# Patient Record
Sex: Female | Born: 1990 | Hispanic: Yes | Marital: Single | Smoking: Never smoker
Health system: Southern US, Community
[De-identification: ages and names within clinical notes are randomized; demographics above are authoritative.]

## PROBLEM LIST (undated history)

## (undated) ENCOUNTER — Inpatient Hospital Stay (HOSPITAL_COMMUNITY): Payer: Self-pay

## (undated) DIAGNOSIS — A749 Chlamydial infection, unspecified: Secondary | ICD-10-CM

## (undated) DIAGNOSIS — O039 Complete or unspecified spontaneous abortion without complication: Secondary | ICD-10-CM

## (undated) DIAGNOSIS — Z789 Other specified health status: Secondary | ICD-10-CM

## (undated) HISTORY — PX: OTHER SURGICAL HISTORY: SHX169

## (undated) HISTORY — DX: Other specified health status: Z78.9

## (undated) HISTORY — DX: Complete or unspecified spontaneous abortion without complication: O03.9

---
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
## 2012-02-26 LAB — OB RESULTS CONSOLE GC/CHLAMYDIA
Chlamydia: NEGATIVE
Gonorrhea: NEGATIVE

## 2012-02-26 LAB — OB RESULTS CONSOLE RUBELLA ANTIBODY, IGM: Rubella: IMMUNE

## 2012-04-12 ENCOUNTER — Encounter (HOSPITAL_COMMUNITY): Payer: Self-pay

## 2012-04-12 ENCOUNTER — Inpatient Hospital Stay (HOSPITAL_COMMUNITY)
Admission: AD | Admit: 2012-04-12 | Discharge: 2012-04-12 | Disposition: A | Discharge status: Home / Self Care | Payer: Self-pay | Source: Ambulatory Visit | Attending: Obstetrics | Admitting: Obstetrics

## 2012-04-12 DIAGNOSIS — R109 Unspecified abdominal pain: Secondary | ICD-10-CM | POA: Insufficient documentation

## 2012-04-12 DIAGNOSIS — O4700 False labor before 37 completed weeks of gestation, unspecified trimester: Secondary | ICD-10-CM | POA: Insufficient documentation

## 2012-04-12 LAB — URINALYSIS, ROUTINE W REFLEX MICROSCOPIC
Hgb urine dipstick: NEGATIVE
Nitrite: NEGATIVE
Protein, ur: NEGATIVE mg/dL
Specific Gravity, Urine: 1.01 (ref 1.005–1.030)
Urobilinogen, UA: 0.2 mg/dL (ref 0.0–1.0)

## 2012-04-12 LAB — FETAL FIBRONECTIN: Fetal Fibronectin: NEGATIVE

## 2012-04-12 LAB — URINE MICROSCOPIC-ADD ON

## 2012-04-12 MED ORDER — TERBUTALINE SULFATE 1 MG/ML IJ SOLN
INTRAMUSCULAR | Status: AC
  Administered 2012-04-12: 1 mg via SUBCUTANEOUS
  Filled 2012-04-12: qty 1

## 2012-04-12 MED ORDER — TERBUTALINE SULFATE 1 MG/ML IJ SOLN
0.2500 mg | Freq: Once | INTRAMUSCULAR | Status: AC
  Administered 2012-04-12: 1 mg via SUBCUTANEOUS
  Filled 2012-04-12: qty 1

## 2012-04-12 MED ORDER — TERBUTALINE SULFATE 1 MG/ML IJ SOLN
0.2500 mg | Freq: Once | INTRAMUSCULAR | Status: AC
  Administered 2012-04-12: 1 mg via SUBCUTANEOUS

## 2012-04-12 MED ORDER — LACTATED RINGERS IV BOLUS (SEPSIS)
1000.0000 mL | Freq: Once | INTRAVENOUS | Status: AC
  Administered 2012-04-12: 1000 mL via INTRAVENOUS

## 2012-04-12 NOTE — MAU Provider Note (Signed)
History     CSN: [id number]  Arrival date and time: 04/12/12 1454   First Provider Initiated Contact with Patient 04/12/12 1547      Chief Complaint  Patient presents with  . Lower abd cramping    HPI  Pt is [redacted]w[redacted]d pregnant and presents with lower abdominal pain since last night. She has back pain associated with the abd pain.    The pain comes and goes.  She denies pain with urination, constipation, fever or chills.  Pt denies spotting, bleeding or LOF.  Baby has been active.  Pt has had some nausea but no vomiting.  She last ate about 3-4 hours ago.  Pt denies complications with her pregnancy. She last saw Dr. Gaynell Face yesterday.  She last had IC 2 days ago.   Past Medical History  Diagnosis Date  . Chlamydia   . Complete abortion 07/27/2011    Past Surgical History  Procedure Date  . Left elbow surgery     Family History  Problem Relation Age of Onset  . Anesthesia problems Neg Hx     History  Substance Use Topics  . Smoking status: Never Smoker   . Smokeless tobacco: Never Used  . Alcohol Use: No     prior to pregnancy    Allergies: No Known Allergies  Prescriptions prior to admission  Medication Sig Dispense Refill  . Prenatal Vit-Fe Fumarate-FA (PRENATAL MULTIVITAMIN) TABS Take 1 tablet by mouth daily.      Marland Kitchen PRESCRIPTION MEDICATION Take 1 tablet by mouth 2 (two) times daily. Pt takes Rx Iron supplement; does not know name/strength        Review of Systems  Constitutional: Negative for fever and chills.  Gastrointestinal: Positive for nausea and abdominal pain. Negative for vomiting, diarrhea and constipation.  Genitourinary: Negative for dysuria and urgency.  Musculoskeletal: Positive for back pain.   Physical Exam   Blood pressure 109/71, pulse 88, temperature 98 F (36.7 C), temperature source Oral, resp. rate 16, height 4' 11.25" (1.505 m), weight 71.385 kg (157 lb 6 oz), last menstrual period 08/21/2011.  Physical Exam  Nursing note and vitals  reviewed. Constitutional: She is oriented to person, place, and time. She appears well-developed and well-nourished.  HENT:  Head: Normocephalic.  Eyes: Pupils are equal, round, and reactive to light.  Neck: Normal range of motion. Neck supple.  Cardiovascular: Normal rate.   Respiratory: Effort normal.  GI: Soft. She exhibits no distension. There is no tenderness. There is no rebound and no guarding.       Uterine irritability with occ ctx; cervix external os 1 cm internal os closed; soft; FHR reactive  Musculoskeletal: Normal range of motion.  Neurological: She is alert and oriented to person, place, and time.  Skin: Skin is warm and dry.  Psychiatric: She has a normal mood and affect.    MAU Course  Procedures Discussed with Dr. Gaynell Face- will give IVF and one dose of terb0.25 and repeat in 30 min if needed Ctx diminished after one dose of terb but was still having some discomfort- 2nd dose of terb was given and pt was comfortable with some uterine irritability but no ctx noted. Results for orders placed during the hospital encounter of 04/12/12 (from the past 24 hour(s))  URINALYSIS, ROUTINE W REFLEX MICROSCOPIC     Status: Abnormal   Collection Time   04/12/12  3:21 PM      Component Value Range   Color, Urine YELLOW  YELLOW   APPearance HAZY (*)  CLEAR   Specific Gravity, Urine 1.010  1.005 - 1.030   pH 7.0  5.0 - 8.0   Glucose, UA NEGATIVE  NEGATIVE mg/dL   Hgb urine dipstick NEGATIVE  NEGATIVE   Bilirubin Urine SMALL (*) NEGATIVE   Ketones, ur NEGATIVE  NEGATIVE mg/dL   Protein, ur NEGATIVE  NEGATIVE mg/dL   Urobilinogen, UA 0.2  0.0 - 1.0 mg/dL   Nitrite NEGATIVE  NEGATIVE   Leukocytes, UA MODERATE (*) NEGATIVE  URINE MICROSCOPIC-ADD ON     Status: Abnormal   Collection Time   04/12/12  3:21 PM      Component Value Range   Squamous Epithelial / LPF MANY (*) RARE   WBC, UA 11-20  <3 WBC/hpf   Bacteria, UA FEW (*) RARE  FETAL FIBRONECTIN     Status: Normal    Collection Time   04/12/12  4:20 PM      Component Value Range   Fetal Fibronectin NEGATIVE  NEGATIVE   Assessment and Plan  Threatened preterm labor D/c home f/u with Dr. Gaynell Face next week Sooner if increase in symptoms  Brandi Austin 04/12/2012, 3:48 PM

## 2012-04-12 NOTE — MAU Note (Signed)
Patient up to bathroom

## 2012-04-12 NOTE — MAU Note (Signed)
Gave patient pitcher of water encourage to drink to decrease uterine irritability, patient verbalized an understanding.

## 2012-04-12 NOTE — MAU Note (Signed)
Pt states lower abd pain/cramping that began last pm, began on r side, now on both sides. Denies uti s/s, nor abnormal vaginal d/c or bleeding. Pain is intermittent.

## 2012-04-12 NOTE — MAU Note (Signed)
Patient states she is feeling less discomfort no contractions seen on efm strip.

## 2012-04-13 LAB — URINE CULTURE: Culture: NO GROWTH

## 2012-05-05 ENCOUNTER — Encounter (HOSPITAL_COMMUNITY): Payer: Self-pay | Admitting: Anesthesiology

## 2012-05-05 ENCOUNTER — Inpatient Hospital Stay (HOSPITAL_COMMUNITY)
Admission: AD | Admit: 2012-05-05 | Discharge: 2012-05-07 | DRG: 775 | Disposition: A | Discharge status: Home / Self Care | Payer: Medicaid Other | Source: Ambulatory Visit | Attending: Obstetrics | Admitting: Obstetrics

## 2012-05-05 ENCOUNTER — Encounter (HOSPITAL_COMMUNITY): Payer: Self-pay | Admitting: Obstetrics and Gynecology

## 2012-05-05 ENCOUNTER — Inpatient Hospital Stay (HOSPITAL_COMMUNITY): Payer: Medicaid Other | Admitting: Anesthesiology

## 2012-05-05 DIAGNOSIS — A749 Chlamydial infection, unspecified: Secondary | ICD-10-CM

## 2012-05-05 DIAGNOSIS — O039 Complete or unspecified spontaneous abortion without complication: Secondary | ICD-10-CM

## 2012-05-05 LAB — CBC
HCT: 38.9 % (ref 36.0–46.0)
MCHC: 32.6 g/dL (ref 30.0–36.0)
Platelets: 231 10*3/uL (ref 150–400)
RDW: 21.4 % — ABNORMAL HIGH (ref 11.5–15.5)
WBC: 9.1 10*3/uL (ref 4.0–10.5)

## 2012-05-05 LAB — RPR: RPR Ser Ql: NONREACTIVE

## 2012-05-05 LAB — TYPE AND SCREEN
ABO/RH(D): O POS
Antibody Screen: NEGATIVE

## 2012-05-05 MED ORDER — IBUPROFEN 600 MG PO TABS
600.0000 mg | ORAL_TABLET | Freq: Four times a day (QID) | ORAL | Status: DC | PRN
  Administered 2012-05-06: 600 mg via ORAL
  Filled 2012-05-05: qty 1

## 2012-05-05 MED ORDER — LACTATED RINGERS IV SOLN
500.0000 mL | INTRAVENOUS | Status: DC | PRN
Start: 2012-05-05 — End: 2012-05-06

## 2012-05-05 MED ORDER — FLEET ENEMA 7-19 GM/118ML RE ENEM: 1.0000 | ENEMA | RECTAL | Status: DC | PRN

## 2012-05-05 MED ORDER — BUTORPHANOL TARTRATE 1 MG/ML IJ SOLN
1.0000 mg | INTRAMUSCULAR | Status: DC | PRN
  Administered 2012-05-05: 1 mg via INTRAVENOUS

## 2012-05-05 MED ORDER — CITRIC ACID-SODIUM CITRATE 334-500 MG/5ML PO SOLN: 30.0000 mL | ORAL | Status: DC | PRN

## 2012-05-05 MED ORDER — BUTORPHANOL TARTRATE 1 MG/ML IJ SOLN
1.0000 mg | INTRAMUSCULAR | Status: DC
  Filled 2012-05-05: qty 1

## 2012-05-05 MED ORDER — AMPICILLIN SODIUM 2 G IJ SOLR
2.0000 g | Freq: Once | INTRAMUSCULAR | Status: AC
  Administered 2012-05-05: 2 g via INTRAVENOUS
  Filled 2012-05-05: qty 2000

## 2012-05-05 MED ORDER — OXYTOCIN BOLUS FROM INFUSION
500.0000 mL | INTRAVENOUS | Status: DC
  Administered 2012-05-05: 500 mL via INTRAVENOUS

## 2012-05-05 MED ORDER — ONDANSETRON HCL 4 MG/2ML IJ SOLN: 4.0000 mg | Freq: Four times a day (QID) | INTRAMUSCULAR | Status: DC | PRN

## 2012-05-05 MED ORDER — PHENYLEPHRINE 40 MCG/ML (10ML) SYRINGE FOR IV PUSH (FOR BLOOD PRESSURE SUPPORT): 80.0000 ug | PREFILLED_SYRINGE | INTRAVENOUS | Status: DC | PRN

## 2012-05-05 MED ORDER — DIPHENHYDRAMINE HCL 50 MG/ML IJ SOLN: 12.5000 mg | INTRAMUSCULAR | Status: DC | PRN

## 2012-05-05 MED ORDER — FENTANYL 2.5 MCG/ML BUPIVACAINE 1/10 % EPIDURAL INFUSION (WH - ANES)
14.0000 mL/h | INTRAMUSCULAR | Status: DC
  Filled 2012-05-05 (×2): qty 125

## 2012-05-05 MED ORDER — OXYTOCIN 40 UNITS IN LACTATED RINGERS INFUSION - SIMPLE MED
62.5000 mL/h | INTRAVENOUS | Status: DC
  Filled 2012-05-05: qty 1000

## 2012-05-05 MED ORDER — LACTATED RINGERS IV SOLN: 500.0000 mL | Freq: Once | INTRAVENOUS | Status: DC

## 2012-05-05 MED ORDER — ACETAMINOPHEN 325 MG PO TABS: 650.0000 mg | ORAL_TABLET | ORAL | Status: DC | PRN

## 2012-05-05 MED ORDER — OXYCODONE-ACETAMINOPHEN 5-325 MG PO TABS
1.0000 | ORAL_TABLET | ORAL | Status: DC | PRN
Start: 2012-05-05 — End: 2012-05-07

## 2012-05-05 MED ORDER — PHENYLEPHRINE 40 MCG/ML (10ML) SYRINGE FOR IV PUSH (FOR BLOOD PRESSURE SUPPORT)
80.0000 ug | PREFILLED_SYRINGE | INTRAVENOUS | Status: DC | PRN
  Filled 2012-05-05: qty 5

## 2012-05-05 MED ORDER — FENTANYL 2.5 MCG/ML BUPIVACAINE 1/10 % EPIDURAL INFUSION (WH - ANES)
INTRAMUSCULAR | Status: DC | PRN
  Administered 2012-05-05: 12 mL/h via EPIDURAL

## 2012-05-05 MED ORDER — EPHEDRINE 5 MG/ML INJ
10.0000 mg | INTRAVENOUS | Status: DC | PRN
  Filled 2012-05-05: qty 4

## 2012-05-05 MED ORDER — PROMETHAZINE HCL 25 MG/ML IJ SOLN
12.5000 mg | Freq: Once | INTRAMUSCULAR | Status: AC
  Administered 2012-05-05: 12.5 mg via INTRAVENOUS
  Filled 2012-05-05: qty 1

## 2012-05-05 MED ORDER — SODIUM BICARBONATE 8.4 % IV SOLN
INTRAVENOUS | Status: DC | PRN
  Administered 2012-05-05: 4 mL via EPIDURAL

## 2012-05-05 MED ORDER — LACTATED RINGERS IV SOLN
INTRAVENOUS | Status: DC
  Administered 2012-05-05 (×3): via INTRAVENOUS

## 2012-05-05 MED ORDER — LIDOCAINE HCL (PF) 1 % IJ SOLN
30.0000 mL | INTRAMUSCULAR | Status: DC | PRN
  Filled 2012-05-05: qty 30

## 2012-05-05 MED ORDER — EPHEDRINE 5 MG/ML INJ: 10.0000 mg | INTRAVENOUS | Status: DC | PRN

## 2012-05-05 NOTE — MAU Note (Signed)
"  I started having contractions stronger about an hour ago.  (+) FM."

## 2012-05-05 NOTE — MAU Note (Signed)
Pt reports having ctx since this morning coming about  q 2-3 min now . Reports some leaking of clear fluid and good fetal movment

## 2012-05-05 NOTE — H&P (Signed)
This is Dr. Francoise Ceo dictating the history and physical on  Brandi Austin she's a 21 year old gravida 2 para 0010 EDC 1214 1337 weeks plus she was admitted in labor with ruptured membranes fluid clear GBS was unknown and she was treated with penicillin Past medical history negative Past surgical history negative System review negative Physical exam well-developed female in labor HEENT negative Breasts negative Heart regular rhythm no murmurs no gallops Lungs clear to P&A Breasts negative Abdomen term Legs negative

## 2012-05-05 NOTE — Anesthesia Preprocedure Evaluation (Signed)

## 2012-05-05 NOTE — Anesthesia Procedure Notes (Signed)
Epidural Patient location during procedure: OB  Preanesthetic Checklist Completed: patient identified, site marked, surgical consent, pre-op evaluation, timeout performed, IV checked, risks and benefits discussed and monitors and equipment checked  Epidural Patient position: sitting Prep: site prepped and draped and DuraPrep Patient monitoring: continuous pulse ox and blood pressure Approach: midline Injection technique: LOR air  Needle:  Needle type: Tuohy  Needle gauge: 17 G Needle length: 9 cm and 9 Needle insertion depth: 5 cm cm Catheter type: closed end flexible Catheter size: 19 Gauge Catheter at skin depth: 10 cm Test dose: negative  Assessment Events: blood not aspirated, injection not painful, no injection resistance, negative IV test and no paresthesia  Additional Notes Dosing of Epidural:  1st dose, through catheter ............................................. epi 1:200K + Xylocaine 40 mg  2nd dose, through catheter, after waiting 3 minutes.....epi 1:200K + Xylocaine 40 mg   ( 2% Xylo charted as a single dose in Epic Meds for ease of charting; actual dosing was fractionated as above, for saftey's sake)  As each dose occurred, patient was free of IV sx; and patient exhibited no evidence of SA injection.  Patient is more comfortable after epidural dosed. Please see RN's note for documentation of vital signs,and FHR which are stable.  Patient reminded not to try to ambulate with numb legs, and that an RN must be present the 1st time she attempts to get up.    

## 2012-05-06 ENCOUNTER — Encounter (HOSPITAL_COMMUNITY): Payer: Self-pay | Admitting: *Deleted

## 2012-05-06 LAB — CBC
Hemoglobin: 9.9 g/dL — ABNORMAL LOW (ref 12.0–15.0)
Platelets: 193 10*3/uL (ref 150–400)
RBC: 3.99 MIL/uL (ref 3.87–5.11)
WBC: 16.9 10*3/uL — ABNORMAL HIGH (ref 4.0–10.5)

## 2012-05-06 LAB — RAPID HIV SCREEN (WH-MAU): Rapid HIV Screen: NONREACTIVE

## 2012-05-06 MED ORDER — IBUPROFEN 600 MG PO TABS
600.0000 mg | ORAL_TABLET | Freq: Four times a day (QID) | ORAL | Status: DC
  Administered 2012-05-06 – 2012-05-07 (×5): 600 mg via ORAL
  Filled 2012-05-06 (×5): qty 1

## 2012-05-06 MED ORDER — SENNOSIDES-DOCUSATE SODIUM 8.6-50 MG PO TABS
2.0000 | ORAL_TABLET | Freq: Every day | ORAL | Status: DC
  Administered 2012-05-06: 2 via ORAL

## 2012-05-06 MED ORDER — LANOLIN HYDROUS EX OINT: TOPICAL_OINTMENT | CUTANEOUS | Status: DC | PRN

## 2012-05-06 MED ORDER — INFLUENZA VIRUS VACC SPLIT PF IM SUSP
0.5000 mL | INTRAMUSCULAR | Status: AC
  Administered 2012-05-07: 0.5 mL via INTRAMUSCULAR
  Filled 2012-05-06: qty 0.5

## 2012-05-06 MED ORDER — PRENATAL MULTIVITAMIN CH
1.0000 | ORAL_TABLET | Freq: Every day | ORAL | Status: DC
  Administered 2012-05-06 – 2012-05-07 (×2): 1 via ORAL
  Filled 2012-05-06 (×2): qty 1

## 2012-05-06 MED ORDER — OXYCODONE-ACETAMINOPHEN 5-325 MG PO TABS: 1.0000 | ORAL_TABLET | ORAL | Status: DC | PRN

## 2012-05-06 MED ORDER — BENZOCAINE-MENTHOL 20-0.5 % EX AERO: 1.0000 "application " | INHALATION_SPRAY | CUTANEOUS | Status: DC | PRN

## 2012-05-06 MED ORDER — DIPHENHYDRAMINE HCL 25 MG PO CAPS: 25.0000 mg | ORAL_CAPSULE | Freq: Four times a day (QID) | ORAL | Status: DC | PRN

## 2012-05-06 MED ORDER — DIBUCAINE 1 % RE OINT: 1.0000 "application " | TOPICAL_OINTMENT | RECTAL | Status: DC | PRN

## 2012-05-06 MED ORDER — TETANUS-DIPHTH-ACELL PERTUSSIS 5-2.5-18.5 LF-MCG/0.5 IM SUSP: 0.5000 mL | Freq: Once | INTRAMUSCULAR | Status: DC

## 2012-05-06 MED ORDER — ONDANSETRON HCL 4 MG/2ML IJ SOLN: 4.0000 mg | INTRAMUSCULAR | Status: DC | PRN

## 2012-05-06 MED ORDER — ZOLPIDEM TARTRATE 5 MG PO TABS: 5.0000 mg | ORAL_TABLET | Freq: Every evening | ORAL | Status: DC | PRN

## 2012-05-06 MED ORDER — ONDANSETRON HCL 4 MG PO TABS: 4.0000 mg | ORAL_TABLET | ORAL | Status: DC | PRN

## 2012-05-06 MED ORDER — FERROUS SULFATE 325 (65 FE) MG PO TABS
325.0000 mg | ORAL_TABLET | Freq: Two times a day (BID) | ORAL | Status: DC
  Administered 2012-05-06 – 2012-05-07 (×3): 325 mg via ORAL
  Filled 2012-05-06 (×3): qty 1

## 2012-05-06 MED ORDER — WITCH HAZEL-GLYCERIN EX PADS: 1.0000 "application " | MEDICATED_PAD | CUTANEOUS | Status: DC | PRN

## 2012-05-06 MED ORDER — SIMETHICONE 80 MG PO CHEW: 80.0000 mg | CHEWABLE_TABLET | ORAL | Status: DC | PRN

## 2012-05-06 NOTE — Progress Notes (Signed)
UR chart review completed.  

## 2012-05-06 NOTE — Progress Notes (Signed)
Patient ID: Brandi Austin, female   DOB: [birth year], 21 y.o.   MRN: [id number] Postpartum day one Fundus firm Lochia moderate Legs negative No complaints

## 2012-05-06 NOTE — Anesthesia Postprocedure Evaluation (Signed)
  Anesthesia Post-op Note  Patient: Brandi Austin  Procedure(s) Performed: * No procedures listed *  Patient Location: Mother/Baby  Anesthesia Type:Epidural  Level of Consciousness: awake, alert  and oriented  Airway and Oxygen Therapy: Patient Spontanous Breathing  Post-op Pain: none  Post-op Assessment: Post-op Vital signs reviewed  Post-op Vital Signs: Reviewed and stable  Complications: No apparent anesthesia complications

## 2012-05-07 NOTE — Discharge Summary (Signed)
Obstetric Discharge Summary Reason for Admission: onset of labor Prenatal Procedures: none Intrapartum Procedures: spontaneous vaginal delivery Postpartum Procedures: none Complications-Operative and Postpartum: none Hemoglobin  Date Value Range Status  05/06/2012 9.9* 12.0 - 15.0 g/dL Final     REPEATED TO VERIFY     DELTA CHECK NOTED     HCT  Date Value Range Status  05/06/2012 31.1* 36.0 - 46.0 % Final    Physical Exam:  General: alert Lochia: appropriate Uterine Fundus: firm Incision: healing well DVT Evaluation: No evidence of DVT seen on physical exam.  Discharge Diagnoses: Term Pregnancy-delivered  Discharge Information: Date: 05/07/2012 Activity: pelvic rest Diet: routine Medications: Percocet Condition: stable Instructions: refer to practice specific booklet Discharge to: home Follow-up Information    Call in 6 weeks to follow up.   Contact information:   b [address]         Newborn Data: Live born female  Birth Weight: 6 lb 12 oz (3062 g) APGAR: 9, 9  Home with mother.  Rahm Minix A 05/07/2012, 7:35 AM

## 2013-04-11 ENCOUNTER — Other Ambulatory Visit: Payer: Self-pay | Admitting: Obstetrics

## 2014-04-13 ENCOUNTER — Encounter (HOSPITAL_COMMUNITY): Payer: Self-pay | Admitting: *Deleted

## 2015-11-16 ENCOUNTER — Encounter (HOSPITAL_COMMUNITY): Payer: Self-pay

## 2015-11-16 ENCOUNTER — Emergency Department (HOSPITAL_COMMUNITY)
Admission: EM | Admit: 2015-11-16 | Discharge: 2015-11-16 | Disposition: A | Discharge status: Home / Self Care | Payer: Medicaid Other | Attending: Emergency Medicine | Admitting: Emergency Medicine

## 2015-11-16 DIAGNOSIS — Z5189 Encounter for other specified aftercare: Secondary | ICD-10-CM

## 2015-11-16 DIAGNOSIS — Z8619 Personal history of other infectious and parasitic diseases: Secondary | ICD-10-CM | POA: Insufficient documentation

## 2015-11-16 DIAGNOSIS — Z4800 Encounter for change or removal of nonsurgical wound dressing: Secondary | ICD-10-CM | POA: Insufficient documentation

## 2015-11-16 NOTE — ED Provider Notes (Signed)
CSN: [id number]     Arrival date & time 11/16/15  1412 History  By signing my name below, I, Essence Howell and Alyssa Grove, attest that this documentation has been prepared under the direction and in the presence of Shawn Joy, PA-C. Electronically Signed: Charline Bills, ED Scribe 11/16/2015 at 3:36 PM.    Chief Complaint  Patient presents with  . Tick Removal    The history is provided by the patient. No language interpreter was used.   HPI Comments: Brandi Austin is a 25 y.o. female who presents to the Emergency Department complaining of pain from a tick bite located on her right lower back onset of this morning. Pt stated that her husband attempted to remove the tick, but she reports that he only removed part of it. Pain is worsened by palpation. No active bleeding from the wound. Pt denies any other complaints.   Past Medical History  Diagnosis Date  . Chlamydia   . Complete abortion 07/27/2011   Past Surgical History  Procedure Laterality Date  . Left elbow surgery     Family History  Problem Relation Age of Onset  . Anesthesia problems Neg Hx    Social History  Substance Use Topics  . Smoking status: Never Smoker   . Smokeless tobacco: Never Used  . Alcohol Use: No     Comment: prior to pregnancy   OB History    Gravida Para Term Preterm AB TAB SAB Ectopic Multiple Living   Review of Systems  Constitutional: Negative for fever.  Skin: Positive for wound. Negative for rash.    Allergies  Review of patient's allergies indicates no known allergies.  Home Medications   Prior to Admission medications   Not on File   BP 122/76 mmHg  Pulse 86  Temp(Src) 98.3 F (36.8 C) (Oral)  Resp 18  Ht  (1.6 m)  Wt 160 lb (72.576 kg)  BMI 28.35 kg/m2  SpO2 100%  LMP 11/08/2015 Physical Exam  Constitutional: She is oriented to person, place, and time. She appears well-developed and well-nourished. No distress.  HENT:  Head:  Normocephalic and atraumatic.  Eyes: Conjunctivae are normal.  Neck: Neck supple.  Cardiovascular: Normal rate.   Pulmonary/Chest: Effort normal. No respiratory distress.  Musculoskeletal: Normal range of motion.  Neurological: She is alert and oriented to person, place, and time.  Skin: Skin is warm and dry.  Tiny, superficial wound to right lower back No bleeding or exudate No foreign body noted  Psychiatric: She has a normal mood and affect. Her behavior is normal.  Nursing note and vitals reviewed.   ED Course  Procedures (including critical care time) DIAGNOSTIC STUDIES: Oxygen Saturation is 100% on RA, normal by my interpretation.    COORDINATION OF CARE: 3:28 PM-Discussed treatment plan which includes wound care with pt at bedside and pt agreed to plan.    EKG Interpretation None         MDM   Final diagnoses:  Visit for wound check    TAMMRA PRESSMAN presents for a check of a possible tick bite.  No tick or foreign body noted. No evidence to suggest cellulitis. Wound care and return precautions discussed, including checking for signs of infection. Patient voiced understanding of these instructions and is comfortable with discharge.  I personally performed the services described in this documentation, which was scribed in my presence. The recorded information has  been reviewed and is accurate.   Anselm PancoastShawn C Joy, PA-C 11/16/15 1604  Glynn OctaveStephen Rancour, MD 11/16/15 1650

## 2015-11-16 NOTE — ED Notes (Signed)
Rt. Flank area, pt. Felt a tick on her and her husband attempted to remove it , but left the head .  Area is stinging.  Mild redness noted.

## 2015-11-16 NOTE — ED Notes (Signed)
I & D tray at bedside if needed. 

## 2015-11-16 NOTE — Discharge Instructions (Signed)
You have been seen today for a wound check of a possible tick bite. There were no signs of a tick on your skin. Keep the area clean and dry. He may apply topical antibiotic ointment, such as Neosporin. Follow up with PCP as needed should symptoms continue. Return to ED should symptoms worsen or there is spreading redness, fever, increased pain, or swelling. May use naproxen, ibuprofen, or Tylenol for pain.

## 2015-11-16 NOTE — ED Notes (Signed)
Declined W/C at D/C and was escorted to lobby by RN. 

## 2018-03-12 ENCOUNTER — Encounter (HOSPITAL_COMMUNITY): Payer: Self-pay | Admitting: Emergency Medicine

## 2018-03-12 ENCOUNTER — Emergency Department (HOSPITAL_COMMUNITY): Payer: Self-pay

## 2018-03-12 ENCOUNTER — Emergency Department (HOSPITAL_COMMUNITY)
Admission: EM | Admit: 2018-03-12 | Discharge: 2018-03-12 | Disposition: A | Discharge status: Home / Self Care | Payer: Self-pay | Attending: Emergency Medicine | Admitting: Emergency Medicine

## 2018-03-12 ENCOUNTER — Other Ambulatory Visit: Payer: Self-pay

## 2018-03-12 DIAGNOSIS — Z79899 Other long term (current) drug therapy: Secondary | ICD-10-CM | POA: Insufficient documentation

## 2018-03-12 DIAGNOSIS — M545 Low back pain: Secondary | ICD-10-CM | POA: Insufficient documentation

## 2018-03-12 DIAGNOSIS — R1011 Right upper quadrant pain: Secondary | ICD-10-CM | POA: Insufficient documentation

## 2018-03-12 DIAGNOSIS — R110 Nausea: Secondary | ICD-10-CM | POA: Insufficient documentation

## 2018-03-12 LAB — URINALYSIS, ROUTINE W REFLEX MICROSCOPIC
BILIRUBIN URINE: NEGATIVE
Glucose, UA: NEGATIVE mg/dL
Ketones, ur: NEGATIVE mg/dL
Nitrite: NEGATIVE
PH: 5 (ref 5.0–8.0)
Protein, ur: NEGATIVE mg/dL
SPECIFIC GRAVITY, URINE: 1.021 (ref 1.005–1.030)

## 2018-03-12 LAB — CBC
HCT: 38.4 % (ref 36.0–46.0)
Hemoglobin: 12.6 g/dL (ref 12.0–15.0)
MCH: 30.2 pg (ref 26.0–34.0)
MCHC: 32.8 g/dL (ref 30.0–36.0)
MCV: 92.1 fL (ref 78.0–100.0)
PLATELETS: 206 10*3/uL (ref 150–400)
RBC: 4.17 MIL/uL (ref 3.87–5.11)
RDW: 13.5 % (ref 11.5–15.5)
WBC: 8.3 10*3/uL (ref 4.0–10.5)

## 2018-03-12 LAB — DIFFERENTIAL
ABS IMMATURE GRANULOCYTES: 0 10*3/uL (ref 0.0–0.1)
BASOS PCT: 0 %
Basophils Absolute: 0 10*3/uL (ref 0.0–0.1)
Eosinophils Absolute: 0.3 10*3/uL (ref 0.0–0.7)
Eosinophils Relative: 3 %
IMMATURE GRANULOCYTES: 1 %
LYMPHS ABS: 2.2 10*3/uL (ref 0.7–4.0)
Lymphocytes Relative: 26 %
Monocytes Absolute: 0.6 10*3/uL (ref 0.1–1.0)
Monocytes Relative: 8 %
NEUTROS PCT: 62 %
Neutro Abs: 5.1 10*3/uL (ref 1.7–7.7)

## 2018-03-12 LAB — COMPREHENSIVE METABOLIC PANEL
ALK PHOS: 75 U/L (ref 38–126)
ALT: 27 U/L (ref 0–44)
AST: 24 U/L (ref 15–41)
Albumin: 3.8 g/dL (ref 3.5–5.0)
Anion gap: 7 (ref 5–15)
BILIRUBIN TOTAL: 0.4 mg/dL (ref 0.3–1.2)
BUN: 12 mg/dL (ref 6–20)
CALCIUM: 8.9 mg/dL (ref 8.9–10.3)
CO2: 24 mmol/L (ref 22–32)
CREATININE: 0.65 mg/dL (ref 0.44–1.00)
Chloride: 106 mmol/L (ref 98–111)
GFR calc Af Amer: >60 mL/min (ref >60)
GFR calc non Af Amer: >60 mL/min (ref >60)
Glucose, Bld: 105 mg/dL — ABNORMAL HIGH (ref 70–99)
Potassium: 3.8 mmol/L (ref 3.5–5.1)
Sodium: 137 mmol/L (ref 135–145)
TOTAL PROTEIN: 6.9 g/dL (ref 6.5–8.1)

## 2018-03-12 LAB — I-STAT BETA HCG BLOOD, ED (MC, WL, AP ONLY): I-stat hCG, quantitative: <5 m[IU]/mL (ref <5)

## 2018-03-12 LAB — LIPASE, BLOOD: Lipase: 33 U/L (ref 11–51)

## 2018-03-12 LAB — HCG, QUANTITATIVE, PREGNANCY

## 2018-03-12 MED ORDER — ONDANSETRON 4 MG PO TBDP: 4.0000 mg | ORAL_TABLET | Freq: Three times a day (TID) | ORAL | 0 refills | Status: DC | PRN

## 2018-03-12 MED ORDER — DICYCLOMINE HCL 20 MG PO TABS: 20.0000 mg | ORAL_TABLET | Freq: Two times a day (BID) | ORAL | 0 refills | Status: DC

## 2018-03-12 NOTE — ED Notes (Signed)
ED Provider at bedside. 

## 2018-03-12 NOTE — ED Notes (Signed)
Got patient on the monitor into a gown patient is resting with family at bedside and call bell in reach

## 2018-03-12 NOTE — ED Provider Notes (Signed)
[facility] EMERGENCY DEPARTMENT Provider Note   CSN: [id number] Arrival date & time: 03/12/18  0734     History   Chief Complaint Chief Complaint  Patient presents with  . Abdominal Pain    HPI Brandi Austin is a 27 y.o. female.  HPI   Brandi Austin is a 27 y.o. female, with a history of chlamydia and abortion, presenting to the ED with abdominal pain beginning last week.   Pain began in RUQ, intermittent, pressure, 7/10, initially nonradiating. Seemed to occur with palpation or bending over. Pain now involves the bilateral lower back and lower abdominal pain, still feels like pressure/cramping, still intermittent. Accompanied by intermittent nausea and loose stools beginning 9/27 with 2-3 stools a day.  LMP began 9/27, still spotting, normal timing and flow for her. States she had a positive pregnancy test at home on 9/25, went to health dept 9/27 and test was negative. Denies fever/chills, vomiting, hematochezia/melena, urinary complaints, abnormal vaginal discharge, SOB, or any other complaints.     Past Medical History:  Diagnosis Date  . Chlamydia   . Complete abortion 07/27/2011    Patient Active Problem List   Diagnosis Date Noted  . Complete abortion 07/27/2011  . Chlamydia 07/27/2011    Past Surgical History:  Procedure Laterality Date  . left elbow surgery       OB History    Gravida  2   Para  1   Term  1   Preterm      AB  1   Living  1     SAB  1   TAB      Ectopic      Multiple      Live Births  1            Home Medications    Prior to Admission medications   Medication Sig Start Date End Date Taking? Authorizing Provider  Multiple Vitamin (MULTIVITAMIN) tablet Take 1 tablet by mouth daily.   Yes [provider]  dicyclomine (BENTYL) 20 MG tablet Take 1 tablet (20 mg total) by mouth 2 (two) times daily. 03/12/18   Joy, Shawn C, PA-C  ondansetron (ZOFRAN ODT) 4 MG disintegrating  tablet Take 1 tablet (4 mg total) by mouth every 8 (eight) hours as needed for nausea or vomiting. 03/12/18   Joy, Hillard Danker, PA-C    Family History Family History  Problem Relation Age of Onset  . Anesthesia problems Neg Hx     Social History Social History   Tobacco Use  . Smoking status: Never Smoker  . Smokeless tobacco: Never Used  Substance Use Topics  . Alcohol use: No    Comment: prior to pregnancy  . Drug use: No     Allergies   Patient has no known allergies.   Review of Systems Review of Systems  Constitutional: Negative for chills, diaphoresis and fever.  Respiratory: Negative for shortness of breath.   Cardiovascular: Negative for chest pain.  Gastrointestinal: Positive for abdominal pain and nausea. Negative for abdominal distention, blood in stool, constipation and vomiting.  Genitourinary: Negative for dysuria, frequency, hematuria and vaginal discharge.  Musculoskeletal: Positive for back pain.  Neurological: Negative for weakness and numbness.  All other systems reviewed and are negative.    Physical Exam Updated Vital Signs BP 132/69 (BP Location: Right Arm)   Pulse 65   Temp 98.6 F (37 C) (Oral)   Resp 14   Wt 74.8 kg  LMP 03/09/2018   SpO2 98%   BMI 29.23 kg/m   Physical Exam  Constitutional: She appears well-developed and well-nourished. No distress.  HENT:  Head: Normocephalic and atraumatic.  Eyes: Conjunctivae are normal.  Neck: Neck supple.  Cardiovascular: Normal rate, regular rhythm, normal heart sounds and intact distal pulses.  Pulmonary/Chest: Effort normal and breath sounds normal. No respiratory distress.  Abdominal: Soft. Bowel sounds are normal. There is tenderness. There is no guarding and no CVA tenderness.    Musculoskeletal: She exhibits no edema.  Lymphadenopathy:    She has no cervical adenopathy.  Neurological: She is alert.  Skin: Skin is warm and dry. She is not diaphoretic.  Psychiatric: She has a normal  mood and affect. Her behavior is normal.  Nursing note and vitals reviewed.    ED Treatments / Results  Labs (all labs ordered are listed, but only abnormal results are displayed) Labs Reviewed  COMPREHENSIVE METABOLIC PANEL - Abnormal; Notable for the following components:      Result Value   Glucose, Bld 105 (*)    All other components within normal limits  URINALYSIS, ROUTINE W REFLEX MICROSCOPIC - Abnormal; Notable for the following components:   APPearance HAZY (*)    Hgb urine dipstick MODERATE (*)    Leukocytes, UA TRACE (*)    Bacteria, UA RARE (*)    All other components within normal limits  URINE CULTURE  LIPASE, BLOOD  CBC  DIFFERENTIAL  HCG, QUANTITATIVE, PREGNANCY  I-STAT BETA HCG BLOOD, ED ([facility], [facility], [facility] ONLY)    EKG None  Radiology US Abdomen Limited Ruq  Result Date: 03/12/2018 CLINICAL DATA:  Right upper quadrant pain. EXAM: ULTRASOUND ABDOMEN LIMITED RIGHT UPPER QUADRANT COMPARISON:  No recent prior. FINDINGS: Gallbladder: No gallstones or wall thickening visualized. No sonographic Murphy sign noted by sonographer. Common bile duct: Diameter: 3.4 mm Liver: Increased echogenicity consistent fatty infiltration and/or hepatocellular disease. Portal vein is patent on color Doppler imaging with normal direction of blood flow towards the liver. IMPRESSION: Negative exam.  No gallstones or biliary distention. Electronically Signed   By: Maisie Fus  Register   On: 03/12/2018 09:39    Procedures Procedures (including critical care time)  Medications Ordered in ED Medications - No data to display   Initial Impression / Assessment and Plan / ED Course  I have reviewed the triage vital signs and the nursing notes.  Pertinent labs & imaging results that were available during my care of the patient were reviewed by me and considered in my medical decision making (see chart for details).  Clinical Course as of Mar 12 1037  Tue Mar 12, 2018  1027 Discussed lab and  imaging results.  Patient's pain has resolved.  Repeat abdominal exam benign.   [SJ]    Clinical Course User Index [SJ] Joy, Shawn C, PA-C    Patient presents with abdominal pain. Patient is nontoxic appearing, afebrile, not tachycardic, not tachypneic, not hypotensive, maintains excellent SPO2 on room air, and is in no apparent distress.  Lab results reassuring.  No acute abnormality is on ultrasound.  Shared decision-making was used regarding CT.  Opted to try some medication management. The patient was given instructions for home care as well as return precautions. Patient voices understanding of these instructions, accepts the plan, and is comfortable with discharge.  Findings and plan of care discussed with Kennis Carina, MD.   Vitals:   03/12/18 0740 03/12/18 0741 03/12/18 0830 03/12/18 0946  BP:  132/69  111/87  Pulse:  65 62 68  Resp:  14    Temp:  98.6 F (37 C)    TempSrc:  Oral    SpO2:  98% 99% 99%  Weight: 74.8 kg        Final Clinical Impressions(s) / ED Diagnoses   Final diagnoses:  RUQ pain    ED Discharge Orders         Ordered    dicyclomine (BENTYL) 20 MG tablet  2 times daily     03/12/18 1032    ondansetron (ZOFRAN ODT) 4 MG disintegrating tablet  Every 8 hours PRN     03/12/18 1032           Anselm Pancoast, PA-C 03/12/18 1038    Sabas Sous, MD 03/12/18 [phone]

## 2018-03-12 NOTE — ED Triage Notes (Signed)
Patient c/o lower back pain and right sided abdominal pain for approx 1 week. Intermittent nausea and diarrhea since Friday. Describes pain as cramping.  Pt adds that she had a positive pregnancy test Wednesday, had follow up appt Friday and test was negative. Light bleeding began Saturday.

## 2018-03-12 NOTE — Discharge Instructions (Addendum)
Abdominal pain, Nausea, and Diarrhea  Hand washing: Wash your hands throughout the day, but especially before and after touching the face, using the restroom, sneezing, coughing, or touching surfaces that have been coughed or sneezed upon. Hydration: Symptoms will be intensified and complicated by dehydration. Dehydration can also extend the duration of symptoms. Drink plenty of fluids and get plenty of rest. You should be drinking at least half a liter of water an hour to stay hydrated. Electrolyte drinks (ex. Gatorade, Powerade, Pedialyte) are also encouraged. You should be drinking enough fluids to make your urine light yellow, almost clear. If this is not the case, you are not drinking enough water. Please note that some of the treatments indicated below will not be effective if you are not adequately hydrated. Diet: Please concentrate on hydration, however, you may introduce food slowly.  Start with a clear liquid diet, progressed to a full liquid diet, and then bland solids as you are able. Pain or fever: Ibuprofen, Naproxen, or Tylenol for pain.  Nausea/vomiting: Use the Zofran for nausea or vomiting. Diarrhea: May use medications such as loperamide (Imodium) or Bismuth subsalicylate (Pepto-Bismol). Bentyl: This medication is what is known as an antispasmodic and is intended to help reduce abdominal discomfort. Follow-up: Follow-up with a primary care provider on this matter. Return: Return should you develop a fever, bloody diarrhea, increased abdominal pain, uncontrolled vomiting, or any other major concerns.  For prescription assistance, may try using prescription discount sites or apps, such as goodrx.com

## 2018-03-13 LAB — URINE CULTURE: Culture: NO GROWTH

## 2018-06-12 NOTE — L&D Delivery Note (Signed)
Brandi Austin is a 28 y.o. female [phone] with IUP at [redacted]w[redacted]d admitted for spontaneous onset of labor.  She progressed with AROM augmentation to complete and pushed 10 minutes to deliver.  Cord clamping delayed by several minutes then clamped by CNM and cut by FOB.     Delivery Note At 3:08 PM a viable female was delivered via Vaginal, Spontaneous (Presentation: ROA).  APGAR: 8, 9; weight pending.   Placenta status: spontaneous, intact.  Cord:  3 vessels  Anesthesia:  epidural Episiotomy:  n/a Lacerations:  1st degree perineal, hemostatic, unrepaired Suture Repair: n/a Est. Blood Loss (mL):  100  Mom to postpartum.  Baby to Couplet care / Skin to Skin.  Wende Mott CNM 02/02/2019, 3:21 PM

## 2018-06-19 ENCOUNTER — Inpatient Hospital Stay (HOSPITAL_COMMUNITY)
Admission: AD | Admit: 2018-06-19 | Discharge: 2018-06-19 | Disposition: A | Discharge status: Home / Self Care | Payer: Self-pay | Attending: Family Medicine | Admitting: Family Medicine

## 2018-06-19 ENCOUNTER — Encounter (HOSPITAL_COMMUNITY): Payer: Self-pay | Admitting: *Deleted

## 2018-06-19 ENCOUNTER — Other Ambulatory Visit: Payer: Self-pay

## 2018-06-19 DIAGNOSIS — S3992XA Unspecified injury of lower back, initial encounter: Secondary | ICD-10-CM | POA: Insufficient documentation

## 2018-06-19 DIAGNOSIS — O26891 Other specified pregnancy related conditions, first trimester: Secondary | ICD-10-CM | POA: Insufficient documentation

## 2018-06-19 DIAGNOSIS — W182XXA Fall in (into) shower or empty bathtub, initial encounter: Secondary | ICD-10-CM | POA: Insufficient documentation

## 2018-06-19 DIAGNOSIS — Z3A01 Less than 8 weeks gestation of pregnancy: Secondary | ICD-10-CM | POA: Insufficient documentation

## 2018-06-19 LAB — URINALYSIS, ROUTINE W REFLEX MICROSCOPIC
Bacteria, UA: NONE SEEN
Bilirubin Urine: NEGATIVE
GLUCOSE, UA: NEGATIVE mg/dL
Hgb urine dipstick: NEGATIVE
Ketones, ur: NEGATIVE mg/dL
Nitrite: NEGATIVE
PH: 5 (ref 5.0–8.0)
Protein, ur: NEGATIVE mg/dL
Specific Gravity, Urine: 1.027 (ref 1.005–1.030)

## 2018-06-19 NOTE — MAU Provider Note (Signed)
Chief Complaint: Fall   First Provider Initiated Contact with Patient 06/19/18 1054     SUBJECTIVE HPI: Brandi Austin is a 28 y.o. [id number] at [redacted]w[redacted]d who presents to M[facility] Admissions reporting back pain after a fall. Fall occurred last night. States she was standing in the bathtub when she fell and landed on her bottom. Pain in her right lower back/buttocks since then. Denies abdominal pain or vaginal bleeding. Denies fever/chills, saddle numbness, fecal/urinary incontinence, or LE weakness.  Had pregnancy confirmed at [facility] & has f/u appt with them on 1/30.  Location: right coccyx Quality: sore, aching Severity: 5/10 on pain scale Duration: 1 day Timing: constant Modifying factors: walking and sitting makes pain worse. Has not treated symptoms.  Associated signs and symptoms: none  Past Medical History:  Diagnosis Date  . Chlamydia   . Complete abortion 07/27/2011   OB History  Gravida Para Term Preterm AB Living  4 2 2   1 2   SAB TAB Ectopic Multiple Live Births  1       2    # Outcome Date GA Lbr Len/2nd Weight Sex Delivery Anes PTL Lv  4 Current           3 Term 05/05/12 [redacted]w[redacted]d 11:59 / 00:25 3062 g F Vag-Spont EPI  LIV  2 Term      Vag-Spont  N LIV  1 SAB            Past Surgical History:  Procedure Laterality Date  . left elbow surgery     Social History   Socioeconomic History  . Marital status: Legally Separated    Spouse name: Not on file  . Number of children: Not on file  . Years of education: Not on file  . Highest education level: Not on file  Occupational History  . Not on file  Social Needs  . Financial resource strain: Not on file  . Food insecurity:    Worry: Not on file    Inability: Not on file  . Transportation needs:    Medical: Not on file    Non-medical: Not on file  Tobacco Use  . Smoking status: Never Smoker  . Smokeless tobacco: Never Used  Substance and Sexual Activity  . Alcohol use: No    Comment: prior to pregnancy  . Drug  use: No  . Sexual activity: Yes    Birth control/protection: None  Lifestyle  . Physical activity:    Days per week: Not on file    Minutes per session: Not on file  . Stress: Not on file  Relationships  . Social connections:    Talks on phone: Not on file    Gets together: Not on file    Attends religious service: Not on file    Active member of club or organization: Not on file    Attends meetings of clubs or organizations: Not on file    Relationship status: Not on file  . Intimate partner violence:    Fear of current or ex partner: Not on file    Emotionally abused: Not on file    Physically abused: Not on file    Forced sexual activity: Not on file  Other Topics Concern  . Not on file  Social History Narrative  . Not on file   Family History  Problem Relation Age of Onset  . Healthy Father   . Healthy Mother   . Anesthesia problems Neg Hx    No current facility-administered  medications on file prior to encounter.    Current Outpatient Medications on File Prior to Encounter  Medication Sig Dispense Refill  . dicyclomine (BENTYL) 20 MG tablet Take 1 tablet (20 mg total) by mouth 2 (two) times daily. 20 tablet 0  . Multiple Vitamin (MULTIVITAMIN) tablet Take 1 tablet by mouth daily.    . ondansetron (ZOFRAN ODT) 4 MG disintegrating tablet Take 1 tablet (4 mg total) by mouth every 8 (eight) hours as needed for nausea or vomiting. 20 tablet 0   No Known Allergies  I have reviewed patient's Past Medical Hx, Surgical Hx, Family Hx, Social Hx, medications and allergies.   Review of Systems  Constitutional: Negative.   Gastrointestinal: Negative.   Genitourinary: Negative.   Musculoskeletal: Positive for back pain.  Neurological: Negative.     OBJECTIVE Patient Vitals for the past 24 hrs:  BP Temp Temp src Pulse Resp SpO2 Height Weight  06/19/18 1122 116/61 - - 69 16 - - -  06/19/18 0924 110/64 98.6 F (37 C) Oral 64 17 100 % 4\' 9"  (1.448 m) 75.4 kg    Constitutional: Well-developed, well-nourished female in no acute distress.  Cardiovascular: normal rate & rhythm, no murmur Respiratory: normal rate and effort. Lung sounds clear throughout GI: Abd soft, non-tender, Pos BS x 4. No guarding or rebound tenderness MS: TTP over right sacral area. No bruising, swelling, or deformities. Normal strength of BLE.  Neurologic: Alert and oriented x 4.     LAB RESULTS Results for orders placed or performed during the hospital encounter of 06/19/18 (from the past 24 hour(s))  Urinalysis, Routine w reflex microscopic     Status: Abnormal   Collection Time: 06/19/18  9:38 AM  Result Value Ref Range   Color, Urine YELLOW YELLOW   APPearance HAZY (A) CLEAR   Specific Gravity, Urine 1.027 1.005 - 1.030   pH 5.0 5.0 - 8.0   Glucose, UA NEGATIVE NEGATIVE mg/dL   Hgb urine dipstick NEGATIVE NEGATIVE   Bilirubin Urine NEGATIVE NEGATIVE   Ketones, ur NEGATIVE NEGATIVE mg/dL   Protein, ur NEGATIVE NEGATIVE mg/dL   Nitrite NEGATIVE NEGATIVE   Leukocytes, UA SMALL (A) NEGATIVE   RBC / HPF 0-5 0 - 5 RBC/hpf   WBC, UA 11-20 0 - 5 WBC/hpf   Bacteria, UA NONE SEEN NONE SEEN   Squamous Epithelial / LPF 6-10 0 - 5   Mucus PRESENT     IMAGING No results found.  MAU COURSE Orders Placed This Encounter  Procedures  . Urinalysis, Routine w reflex microscopic  . Discharge patient   No orders of the defined types were placed in this encounter.   MDM VSS, NAD Some tenderness over rt lower back. No deformities. No red flag symptoms. Discussed tx at home including tylenol, ice/heat. Discussed reasons to present to ED vs urgent care. F/u with ob/gyn as scheduled.   ASSESSMENT 1. Injury of coccyx, initial encounter     PLAN Discharge home in stable condition.   Allergies as of 06/19/2018   No Known Allergies     Medication List    TAKE these medications   dicyclomine 20 MG tablet Commonly known as:  BENTYL Take 1 tablet (20 mg total) by mouth  2 (two) times daily.   multivitamin tablet Take 1 tablet by mouth daily.   ondansetron 4 MG disintegrating tablet Commonly known as:  ZOFRAN ODT Take 1 tablet (4 mg total) by mouth every 8 (eight) hours as needed for nausea or vomiting.  Judeth Horn, NP 06/19/2018  11:29 AM

## 2018-06-19 NOTE — Discharge Instructions (Signed)
Tailbone Injury  The tailbone (coccyx) is the small bone at the lower end of the spine. A tailbone injury may involve stretched ligaments, bruising, or a broken bone (fracture). Tailbone injuries can be painful, and some may take a long time to heal. What are the causes? This condition may be caused by:  Falling and landing on the tailbone.  Repeated strain or friction from sitting for long periods of time. This may include actions such as rowing and bicycling.  Childbirth. In some cases, the cause may not be known. What are the signs or symptoms? Symptoms of this condition include:  Pain in the tailbone area or lower back, especially when sitting.  Pain or difficulty when standing up from a sitting position.  Bruising or swelling in the tailbone area.  Painful bowel movements.  In women, pain during intercourse. How is this diagnosed? This condition may be diagnosed based on:  Your symptoms.  A physical exam. If your health care provider suspects a fracture, you may have additional tests, such as:  X-rays.  CT scan.  MRI. How is this treated? Most tailbone injuries heal on their own in 4-6 weeks. However, recovery time may be longer if the injury involves a fracture. Treatment for this condition may include:  Tylenol or other over-the-counter medicines to help relieve your pain.  Using a large, rubber or inflated ring or cushion to take pressure off the tailbone when sitting.  Physical therapy.  Injecting the tailbone area with local anesthesia and steroid medicine. This is not normally needed unless the pain does not improve over time with over-the-counter pain medicines. Follow these instructions at home: Activity  Avoid sitting for long periods of time.  To prevent repeating an injury that is caused by strain or friction: ? Wear appropriate padding and sports gear when bicycling and rowing.  Increase your activity as the pain allows. Perform any exercises  that are recommended by your health care provider or physical therapist. Managing pain, stiffness, and swelling  To help decrease discomfort when sitting: ? Sit on your rubber or inflated ring or cushion as told by your health care provider. ? Lean forward when you sit.  If directed, apply ice to the injured area: ? Put ice in a plastic bag. ? Place a towel between your skin and the bag. ? Leave the ice on for 20 minutes, 2-3 times per day for the first 1-2 days.  If directed, apply heat to the affected area as often as told by your health care provider. Use the heat source that your health care provider recommends, such as a moist heat pack or a heating pad. ? Place a towel between your skin and the heat source. ? Leave the heat on for 20-30 minutes. ? Remove the heat if your skin turns bright red. This is especially important if you are unable to feel pain, heat, or cold. You may have a greater risk of getting burned. General instructions  Take over-the-counter and prescription medicines only as told by your health care provider.  To prevent or treat constipation or painful bowel movements, your health care provider may recommend that you: ? Drink enough fluid to keep your urine pale yellow. ? Eat foods that are high in fiber, such as fresh fruits and vegetables, whole grains, and beans. ? Limit foods that are high in fat and processed sugars, such as fried and sweet foods. ? Take an over-the-counter or prescription medicine for constipation.  Keep all follow-up visits as  directed by your health care provider. This is important. Contact a health care provider if:  Your pain becomes worse or is not controlled with medicine.  Your bowel movements cause a great deal of discomfort.  You are unable to have a bowel movement after 4 days.  You have pain during intercourse. Summary  A tailbone injury may involve stretched ligaments, bruising, or a broken bone (fracture).  Tailbone  injuries can be painful. Most heal on their own in 4-6 weeks.  Treatment may include taking NSAIDs, using a rubber or inflated ring or cushion when sitting, and physical therapy.  Follow any recommendations from your health care provider to prevent or treat constipation. This information is not intended to replace advice given to you by your health care provider. Make sure you discuss any questions you have with your health care provider. Document Released: 05/26/2000 Document Revised: 06/26/2017 Document Reviewed: 06/26/2017 Elsevier Interactive Patient Education  2019 [employer].

## 2018-06-19 NOTE — MAU Note (Signed)
Last night she fell in the bathtub, landed on tailbone.  Back pain from the right side up. No bleeding or abd pain.+confirmation at [facility], has paperwork with her

## 2018-08-07 ENCOUNTER — Other Ambulatory Visit (HOSPITAL_COMMUNITY): Payer: Self-pay | Admitting: Family

## 2018-08-07 DIAGNOSIS — Z369 Encounter for antenatal screening, unspecified: Secondary | ICD-10-CM

## 2018-08-13 ENCOUNTER — Ambulatory Visit (HOSPITAL_COMMUNITY): Payer: Self-pay

## 2018-08-13 ENCOUNTER — Ambulatory Visit (HOSPITAL_COMMUNITY): Payer: Self-pay | Admitting: *Deleted

## 2018-08-13 ENCOUNTER — Encounter (HOSPITAL_COMMUNITY): Payer: Self-pay

## 2018-08-13 ENCOUNTER — Ambulatory Visit (HOSPITAL_COMMUNITY)
Admission: RE | Admit: 2018-08-13 | Discharge: 2018-08-13 | Disposition: A | Discharge status: Home / Self Care | Payer: Self-pay | Source: Ambulatory Visit | Attending: Family | Admitting: Family

## 2018-08-13 VITALS — BP 121/63 | HR 84 | Wt 170.2 lb

## 2018-08-13 DIAGNOSIS — Z3682 Encounter for antenatal screening for nuchal translucency: Secondary | ICD-10-CM

## 2018-08-13 DIAGNOSIS — Z3A13 13 weeks gestation of pregnancy: Secondary | ICD-10-CM

## 2018-08-13 DIAGNOSIS — Z369 Encounter for antenatal screening, unspecified: Secondary | ICD-10-CM | POA: Insufficient documentation

## 2018-08-17 ENCOUNTER — Other Ambulatory Visit (HOSPITAL_COMMUNITY): Payer: Self-pay | Admitting: Obstetrics and Gynecology

## 2019-01-20 LAB — OB RESULTS CONSOLE GBS: GBS: NEGATIVE

## 2019-01-26 ENCOUNTER — Encounter (HOSPITAL_COMMUNITY): Payer: Self-pay

## 2019-01-26 ENCOUNTER — Inpatient Hospital Stay (HOSPITAL_COMMUNITY)
Admission: AD | Admit: 2019-01-26 | Discharge: 2019-01-26 | Disposition: A | Discharge status: Home / Self Care | Payer: Self-pay | Source: Ambulatory Visit | Attending: Obstetrics and Gynecology | Admitting: Obstetrics and Gynecology

## 2019-01-26 ENCOUNTER — Other Ambulatory Visit: Payer: Self-pay

## 2019-01-26 DIAGNOSIS — Z3A37 37 weeks gestation of pregnancy: Secondary | ICD-10-CM

## 2019-01-26 DIAGNOSIS — O479 False labor, unspecified: Secondary | ICD-10-CM

## 2019-01-26 DIAGNOSIS — O471 False labor at or after 37 completed weeks of gestation: Secondary | ICD-10-CM

## 2019-01-26 MED ORDER — NALBUPHINE HCL 10 MG/ML IJ SOLN
5.0000 mg | Freq: Once | INTRAMUSCULAR | Status: AC
  Administered 2019-01-26: 5 mg via INTRAMUSCULAR
  Filled 2019-01-26: qty 1

## 2019-01-26 NOTE — MAU Note (Signed)
Pt reports contractions every 2-3 mins since 11p. Denies LOF or vaginal bleeding. Reports good fetal movement. Cervix 1cm 2 weeks ago.

## 2019-01-26 NOTE — Progress Notes (Signed)
I have communicated with Len Blalock, CNM and reviewed vital signs:  Vitals:   01/26/19 0320 01/26/19 0918  BP: 119/75 113/70  Pulse: 80 61  Resp: 20 16  Temp:    SpO2: 100%     Vaginal exam:  Dilation: 3.5 Effacement (%): 50 Cervical Position: Posterior Station: -3 Presentation: Vertex Exam by:: A. Eshaal Duby, RN,   Also reviewed contraction pattern and that non-stress test is reactive.  It has been documented that patient is contracting every 2-5 minutes with minimal cervical change over 5 hours not indicating active labor.  Patient denies any other complaints.  Based on this report provider has given order for discharge.  A discharge order and diagnosis entered by a provider.   Labor discharge instructions reviewed with patient.

## 2019-01-26 NOTE — MAU Provider Note (Signed)
S: Brandi Austin is a 28 y.o. [phone] at [redacted]w[redacted]d  who presents to MAU today complaining contractions q 5-7 minutes since last night. She denies vaginal bleeding. She denies LOF. She reports normal fetal movement.    O: BP 113/70 (BP Location: Right Arm)   Pulse 61   Temp 98.5 F (36.9 C) (Oral)   Resp 16   Ht 5' (1.524 m)   Wt 81.2 kg   LMP 05/09/2018   SpO2 100%   BMI 34.98 kg/m  GENERAL: Well-developed, well-nourished female in no acute distress.  HEAD: Normocephalic, atraumatic.  CHEST: Normal effort of breathing, regular heart rate ABDOMEN: Soft, nontender, gravid  Cervical exam:  Dilation: 3.5 Effacement (%): 50 Cervical Position: Posterior Station: -3 Presentation: Vertex Exam by:: AThurman Coyer, RN   Fetal Monitoring: Baseline: 130 Variability: moderate Accelerations: 15x15 Decelerations: none Contractions: 5-9  Patient was observed for 7+ hours with minimal cervical change.  A: SIUP at [redacted]w[redacted]d  False labor  P: Discharge home Patient to keep scheduled OB appointment.  Patient may return to MAU as needed  Erick Colace 01/26/2019 9:37 AM

## 2019-01-26 NOTE — Discharge Instructions (Signed)

## 2019-01-27 ENCOUNTER — Inpatient Hospital Stay (HOSPITAL_COMMUNITY)
Admission: AD | Admit: 2019-01-27 | Discharge: 2019-01-27 | Disposition: A | Discharge status: Home / Self Care | Payer: Self-pay | Attending: Obstetrics and Gynecology | Admitting: Obstetrics and Gynecology

## 2019-01-27 ENCOUNTER — Other Ambulatory Visit: Payer: Self-pay

## 2019-01-27 ENCOUNTER — Encounter (HOSPITAL_COMMUNITY): Payer: Self-pay | Admitting: *Deleted

## 2019-01-27 DIAGNOSIS — O368130 Decreased fetal movements, third trimester, not applicable or unspecified: Secondary | ICD-10-CM | POA: Insufficient documentation

## 2019-01-27 DIAGNOSIS — Z3689 Encounter for other specified antenatal screening: Secondary | ICD-10-CM

## 2019-01-27 DIAGNOSIS — Z3A37 37 weeks gestation of pregnancy: Secondary | ICD-10-CM | POA: Insufficient documentation

## 2019-01-27 LAB — URINALYSIS, ROUTINE W REFLEX MICROSCOPIC
Bilirubin Urine: NEGATIVE
Glucose, UA: NEGATIVE mg/dL
Hgb urine dipstick: NEGATIVE
Ketones, ur: NEGATIVE mg/dL
Nitrite: NEGATIVE
Protein, ur: 30 mg/dL — AB
Specific Gravity, Urine: 1.023 (ref 1.005–1.030)
WBC, UA: >50 WBC/hpf — ABNORMAL HIGH (ref 0–5)
pH: 5 (ref 5.0–8.0)

## 2019-01-27 NOTE — MAU Note (Signed)
.   Brandi Austin is a 28 y.o. at [redacted]w[redacted]d here in MAU reporting: an increase in vaginal pressure. Pt was evaluated in MAU yesterday for contractions and was 3cms. Denies any VB or LOF. Decrease in fetal movement  Onset of complaint:yesterday Pain score: 6 Vitals:   01/27/19 0928  BP: 117/67  Pulse: 99  Resp: 16  Temp: 97.8 F (36.6 C)  SpO2: 100%     FHT:130 Lab orders placed from triage:

## 2019-01-27 NOTE — Progress Notes (Signed)
Pt. States fetal movement has returned to normal. Marilynne Drivers, RN

## 2019-01-27 NOTE — MAU Provider Note (Signed)
History     CSN: [id number]  Arrival date and time: 01/27/19 [id number]   First Provider Initiated Contact with Patient 01/27/19 1020      Chief Complaint  Patient presents with  . Contractions  . Decreased Fetal Movement   [name] is a 28 y.o. [phone] at [id number] who presents to MAU for feeling decreased fetal movement beginning last night. Pt reports the baby usually moves during the night, but she was not moving as much. Pt reports she is feeling some movement, but not as much as usual. Pt denies feeling only softer movements and reports the decrease in movement is overall lessened.  Last food/drink: today 0800 (PB&J and milk) Smoker? no Current medications/supplements: PNV, last night had Red Raspberry Leaf tea Recent AFI: nothing in computer since 08/2018 Anterior placenta? nothing in computer since 08/2018 Doing FKCs? no Problems this pregnancy include: no Pt denies prior instances of DFM. Pt denies all risk factors for stillbirth, including, but not limited to: IUGR, placental abruption, infection, genetic/congenital anomalies, fetomaternal hemorrhage, DM, HTN, smoking/drug use, umbilical cord/placental abnormalities, uterine abnormalities, fetal hydrops, arrythmia, platelet dysfunction, IHCP.  Pt denies VB, LOF, ctx, vaginal discharge/odor/itching.  Allergies? NKDA Prenatal care provider/next appt? [facility], next appt 01/30/2019  Pt is requesting induction today due to frequent recent trips to MAU. Pt's husband present for entire visit.   OB History    Gravida  4   Para  2   Term  2   Preterm      AB  1   Living  2     SAB  1   TAB      Ectopic      Multiple      Live Births  2           Past Medical History:  Diagnosis Date  . Chlamydia   . Complete abortion 07/27/2011    Past Surgical History:  Procedure Laterality Date  . left elbow surgery      Family History  Problem Relation Age of Onset  . Healthy Father   .  Healthy Mother   . Anesthesia problems Neg Hx     Social History   Tobacco Use  . Smoking status: Never Smoker  . Smokeless tobacco: Never Used  Substance Use Topics  . Alcohol use: No    Comment: prior to pregnancy  . Drug use: No    Allergies: No Known Allergies  Medications Prior to Admission  Medication Sig Dispense Refill Last Dose  . Prenatal Vit w/Fe-Methylfol-FA (PNV PO) Take by mouth.   01/26/2019 at 1500  . dicyclomine (BENTYL) 20 MG tablet Take 1 tablet (20 mg total) by mouth 2 (two) times daily. (Patient not taking: Reported on 08/13/2018) 20 tablet 0   . Multiple Vitamin (MULTIVITAMIN) tablet Take 1 tablet by mouth daily.     . ondansetron (ZOFRAN ODT) 4 MG disintegrating tablet Take 1 tablet (4 mg total) by mouth every 8 (eight) hours as needed for nausea or vomiting. (Patient not taking: Reported on 08/13/2018) 20 tablet 0     Review of Systems  Constitutional: Negative for chills, diaphoresis, fatigue and fever.  Respiratory: Negative for shortness of breath.   Cardiovascular: Negative for chest pain.  Gastrointestinal: Negative for abdominal pain, constipation, diarrhea, nausea and vomiting.  Genitourinary: Negative for dysuria, flank pain, frequency, pelvic pain, urgency, vaginal bleeding and vaginal discharge.  Neurological: Negative for dizziness, weakness, light-headedness and headaches.   Physical Exam  Blood pressure 106/76, pulse 75, temperature 97.8 F (36.6 C), resp. rate 16, last menstrual period 05/09/2018, SpO2 100 %, unknown if currently breastfeeding.  Patient Vitals for the past 24 hrs:  BP Temp Pulse Resp SpO2  01/27/19 1152 106/76 - 75 - -  01/27/19 0928 117/67 97.8 F (36.6 C) 99 16 100 %   Physical Exam  Constitutional: She is oriented to person, place, and time. She appears well-developed and well-nourished. No distress.  HENT:  Head: Normocephalic and atraumatic.  Respiratory: Effort normal.  GI: Soft. She exhibits no distension and  no mass. There is no abdominal tenderness. There is no rebound and no guarding.  Neurological: She is alert and oriented to person, place, and time.  Skin: Skin is warm and dry. She is not diaphoretic.  Psychiatric: She has a normal mood and affect. Her behavior is normal. Judgment and thought content normal.   Results for orders placed or performed during the hospital encounter of 01/27/19 (from the past 24 hour(s))  Urinalysis, Routine w reflex microscopic     Status: Abnormal   Collection Time: 01/27/19  9:24 AM  Result Value Ref Range   Color, Urine AMBER (A) YELLOW   APPearance CLOUDY (A) CLEAR   Specific Gravity, Urine 1.023 1.005 - 1.030   pH 5.0 5.0 - 8.0   Glucose, UA NEGATIVE NEGATIVE mg/dL   Hgb urine dipstick NEGATIVE NEGATIVE   Bilirubin Urine NEGATIVE NEGATIVE   Ketones, ur NEGATIVE NEGATIVE mg/dL   Protein, ur 30 (A) NEGATIVE mg/dL   Nitrite NEGATIVE NEGATIVE   Leukocytes,Ua LARGE (A) NEGATIVE   RBC / HPF 6-10 0 - 5 RBC/hpf   WBC, UA >50 (H) 0 - 5 WBC/hpf   Bacteria, UA MANY (A) NONE SEEN   Squamous Epithelial / LPF 11-20 0 - 5   Mucus PRESENT    No results found.  MAU Course  Procedures  MDM -DFM without risk factors for stillbirth per pt report, without prior instances of DFM -UA: amber/cloudy/30PRO/lg leuks/many bacteria, sending urine for culture -after drinking cold ginger ale, pt reports FM has returned to normal -EFM: reactive       -baseline: 140       -variability: moderate       -accels: present, 15x15       -decels: absent       -TOCO: few ctx, irritability -pt discharged to home in stable condition  Orders Placed This Encounter  Procedures  . Culture, OB Urine    Standing Status:   Standing    Number of Occurrences:   1  . Urinalysis, Routine w reflex microscopic    Standing Status:   Standing    Number of Occurrences:   1  . Discharge patient    Order Specific Question:   Discharge disposition    Answer:   01-Home or Self Care [1]     Order Specific Question:   Discharge patient date    Answer:   01/27/2019   No orders of the defined types were placed in this encounter.  Assessment and Plan   1. Decreased fetal movements in third trimester, single or unspecified fetus   2. NST (non-stress test) reactive   3. [redacted] weeks gestation of pregnancy    Allergies as of 01/27/2019   No Known Allergies     Medication List    TAKE these medications   dicyclomine 20 MG tablet Commonly known as: BENTYL Take 1 tablet (20 mg total) by mouth 2 (two)  times daily.   multivitamin tablet Take 1 tablet by mouth daily.   ondansetron 4 MG disintegrating tablet Commonly known as: Zofran ODT Take 1 tablet (4 mg total) by mouth every 8 (eight) hours as needed for nausea or vomiting.   PNV PO Take by mouth.      -will call with culture results, if positive -encouraged hydration -discussed normal fetal movement across GA -discussed fetal sleep cycles -discussed FKCs and how to elicit movement at home -pt to discuss induction at next OB visit -contact your HCP immediately if you notice any DFM or return to MAU -strict FM/bleeding/pain/labor/return MAU precautions given -pt discharged to home in stable condition  Elmyra Ricks E Bellamarie Pflug 01/27/2019, 12:06 PM

## 2019-01-27 NOTE — Discharge Instructions (Signed)
Signs and Symptoms of Labor Labor is your body's natural process of moving your baby, placenta, and umbilical cord out of your uterus. The process of labor usually starts when your baby is full-term, between 37 and 40 weeks of pregnancy. How will I know when I am close to going into labor? As your body prepares for labor and the birth of your baby, you may notice the following symptoms in the weeks and days before true labor starts:  Having a strong desire to get your home ready to receive your new baby. This is called nesting. Nesting may be a sign that labor is approaching, and it may occur several weeks before birth. Nesting may involve cleaning and organizing your home.  Passing a small amount of thick, bloody mucus out of your vagina (normal bloody show or losing your mucus plug). This may happen more than a week before labor begins, or it might occur right before labor begins as the opening of the cervix starts to widen (dilate). For some women, the entire mucus plug passes at once. For others, smaller portions of the mucus plug may gradually pass over several days.  Your baby moving (dropping) lower in your pelvis to get into position for birth (lightening). When this happens, you may feel more pressure on your bladder and pelvic bone and less pressure on your ribs. This may make it easier to breathe. It may also cause you to need to urinate more often and have problems with bowel movements.  Having "practice contractions" (Braxton Hicks contractions) that occur at irregular (unevenly spaced) intervals that are more than 10 minutes apart. This is also called false labor. False labor contractions are common after exercise or sexual activity, and they will stop if you change position, rest, or drink fluids. These contractions are usually mild and do not get stronger over time. They may feel like: ? A backache or back pain. ? Mild cramps, similar to menstrual cramps. ? Tightening or pressure in  your abdomen. Other early symptoms that labor may be starting soon include:  Nausea or loss of appetite.  Diarrhea.  Having a sudden burst of energy, or feeling very tired.  Mood changes.  Having trouble sleeping. How will I know when labor has begun? Signs that true labor has begun may include:  Having contractions that come at regular (evenly spaced) intervals and increase in intensity. This may feel like more intense tightening or pressure in your abdomen that moves to your back. ? Contractions may also feel like rhythmic pain in your upper thighs or back that comes and goes at regular intervals. ? For first-time mothers, this change in intensity of contractions often occurs at a more gradual pace. ? Women who have given birth before may notice a more rapid progression of contraction changes.  Having a feeling of pressure in the vaginal area.  Your water breaking (rupture of membranes). This is when the sac of fluid that surrounds your baby breaks. When this happens, you will notice fluid leaking from your vagina. This may be clear or blood-tinged. Labor usually starts within 24 hours of your water breaking, but it may take longer to begin. ? Some women notice this as a gush of fluid. ? Others notice that their underwear repeatedly becomes damp. Follow these instructions at home:   When labor starts, or if your water breaks, call your health care provider or nurse care line. Based on your situation, they will determine when you should go in for an   exam.  When you are in early labor, you may be able to rest and manage symptoms at home. Some strategies to try at home include: ? Breathing and relaxation techniques. ? Taking a warm bath or shower. ? Listening to music. ? Using a heating pad on the lower back for pain. If you are directed to use heat:  Place a towel between your skin and the heat source.  Leave the heat on for 20-30 minutes.  Remove the heat if your skin turns  bright red. This is especially important if you are unable to feel pain, heat, or cold. You may have a greater risk of getting burned. Get help right away if:  You have painful, regular contractions that are 5 minutes apart or less.  Labor starts before you are [redacted] weeks along in your pregnancy.  You have a fever.  You have a headache that does not go away.  You have bright red blood coming from your vagina.  You do not feel your baby moving.  You have a sudden onset of: ? Severe headache with vision problems. ? Nausea, vomiting, or diarrhea. ? Chest pain or shortness of breath. These symptoms may be an emergency. If your health care provider recommends that you go to the hospital or birth center where you plan to deliver, do not drive yourself. Have someone else drive you, or call emergency services (911 in the [place] Summary  Labor is your body's natural process of moving your baby, placenta, and umbilical cord out of your uterus.  The process of labor usually starts when your baby is full-term, between 37 and 40 weeks of pregnancy.  When labor starts, or if your water breaks, call your health care provider or nurse care line. Based on your situation, they will determine when you should go in for an exam. This information is not intended to replace advice given to you by your health care provider. Make sure you discuss any questions you have with your health care provider. Document Released: 11/03/2016 Document Revised: 02/26/2017 Document Reviewed: 11/03/2016 Elsevier Patient Education  2020 [employer]  Fetal Movement Counts Patient Name: ________________________________________________ Patient Due Date: ____________________ What is a fetal movement count?  A fetal movement count is the number of times that you feel your baby move during a certain amount of time. This may also be called a fetal kick count. A fetal movement count is recommended for every pregnant woman. You may  be asked to start counting fetal movements as early as week 28 of your pregnancy. Pay attention to when your baby is most active. You may notice your baby's sleep and wake cycles. You may also notice things that make your baby move more. You should do a fetal movement count:  When your baby is normally most active.  At the same time each day. A good time to count movements is while you are resting, after having something to eat and drink. How do I count fetal movements? 1. Find a quiet, comfortable area. Sit, or lie down on your side. 2. Write down the date, the start time and stop time, and the number of movements that you felt between those two times. Take this information with you to your health care visits. 3. For 2 hours, count kicks, flutters, swishes, rolls, and jabs. You should feel at least 10 movements during 2 hours. 4. You may stop counting after you have felt 10 movements. 5. If you do not feel 10 movements in 2   hours, have something to eat and drink. Then, keep resting and counting for 1 hour. If you feel at least 4 movements during that hour, you may stop counting. Contact a health care provider if:  You feel fewer than 4 movements in 2 hours.  Your baby is not moving like he or she usually does. Date: ____________ Start time: ____________ Stop time: ____________ Movements: ____________ Date: ____________ Start time: ____________ Stop time: ____________ Movements: ____________ Date: ____________ Start time: ____________ Stop time: ____________ Movements: ____________ Date: ____________ Start time: ____________ Stop time: ____________ Movements: ____________ Date: ____________ Start time: ____________ Stop time: ____________ Movements: ____________ Date: ____________ Start time: ____________ Stop time: ____________ Movements: ____________ Date: ____________ Start time: ____________ Stop time: ____________ Movements: ____________ Date: ____________ Start time: ____________ Stop  time: ____________ Movements: ____________ Date: ____________ Start time: ____________ Stop time: ____________ Movements: ____________ This information is not intended to replace advice given to you by your health care provider. Make sure you discuss any questions you have with your health care provider. Document Released: 06/28/2006 Document Revised: 06/18/2018 Document Reviewed: 07/08/2015 Elsevier Patient Education  2020 Elsevier Inc.  

## 2019-01-27 NOTE — Progress Notes (Signed)
Pt states she has felt baby move since arrival at MAU today.Marilynne Drivers, RN

## 2019-01-28 LAB — CULTURE, OB URINE

## 2019-02-02 ENCOUNTER — Inpatient Hospital Stay (HOSPITAL_COMMUNITY): Payer: Medicaid Other | Admitting: Anesthesiology

## 2019-02-02 ENCOUNTER — Other Ambulatory Visit: Payer: Self-pay

## 2019-02-02 ENCOUNTER — Inpatient Hospital Stay (HOSPITAL_COMMUNITY)
Admission: AD | Admit: 2019-02-02 | Discharge: 2019-02-03 | DRG: 807 | Disposition: A | Discharge status: Home / Self Care | Payer: Medicaid Other | Attending: Obstetrics & Gynecology | Admitting: Obstetrics & Gynecology

## 2019-02-02 ENCOUNTER — Encounter (HOSPITAL_COMMUNITY): Payer: Self-pay | Admitting: *Deleted

## 2019-02-02 DIAGNOSIS — Z20828 Contact with and (suspected) exposure to other viral communicable diseases: Secondary | ICD-10-CM | POA: Diagnosis present

## 2019-02-02 DIAGNOSIS — I959 Hypotension, unspecified: Secondary | ICD-10-CM | POA: Diagnosis not present

## 2019-02-02 DIAGNOSIS — Z3A38 38 weeks gestation of pregnancy: Secondary | ICD-10-CM | POA: Diagnosis not present

## 2019-02-02 DIAGNOSIS — O9081 Anemia of the puerperium: Secondary | ICD-10-CM | POA: Diagnosis not present

## 2019-02-02 DIAGNOSIS — O9089 Other complications of the puerperium, not elsewhere classified: Secondary | ICD-10-CM | POA: Diagnosis not present

## 2019-02-02 DIAGNOSIS — O26893 Other specified pregnancy related conditions, third trimester: Secondary | ICD-10-CM | POA: Diagnosis present

## 2019-02-02 LAB — TYPE AND SCREEN
ABO/RH(D): O POS
Antibody Screen: NEGATIVE

## 2019-02-02 LAB — CBC
HCT: 37.5 % (ref 36.0–46.0)
Hemoglobin: 11.9 g/dL — ABNORMAL LOW (ref 12.0–15.0)
MCH: 25.5 pg — ABNORMAL LOW (ref 26.0–34.0)
MCHC: 31.7 g/dL (ref 30.0–36.0)
MCV: 80.3 fL (ref 80.0–100.0)
Platelets: 218 10*3/uL (ref 150–400)
RBC: 4.67 MIL/uL (ref 3.87–5.11)
RDW: 15.1 % (ref 11.5–15.5)
WBC: 11 10*3/uL — ABNORMAL HIGH (ref 4.0–10.5)
nRBC: 0 % (ref 0.0–0.2)

## 2019-02-02 LAB — SARS CORONAVIRUS 2 (TAT 6-24 HRS): SARS Coronavirus 2: NEGATIVE

## 2019-02-02 LAB — ABO/RH: ABO/RH(D): O POS

## 2019-02-02 MED ORDER — ONDANSETRON HCL 4 MG/2ML IJ SOLN: 4.0000 mg | INTRAMUSCULAR | Status: DC | PRN

## 2019-02-02 MED ORDER — BENZOCAINE-MENTHOL 20-0.5 % EX AERO: 1.0000 "application " | INHALATION_SPRAY | CUTANEOUS | Status: DC | PRN

## 2019-02-02 MED ORDER — DIPHENHYDRAMINE HCL 50 MG/ML IJ SOLN: 12.5000 mg | INTRAMUSCULAR | Status: DC | PRN

## 2019-02-02 MED ORDER — COCONUT OIL OIL
1.0000 "application " | TOPICAL_OIL | Status: DC | PRN
  Administered 2019-02-02: 1 via TOPICAL

## 2019-02-02 MED ORDER — ONDANSETRON HCL 4 MG PO TABS: 4.0000 mg | ORAL_TABLET | ORAL | Status: DC | PRN

## 2019-02-02 MED ORDER — FLEET ENEMA 7-19 GM/118ML RE ENEM: 1.0000 | ENEMA | RECTAL | Status: DC | PRN

## 2019-02-02 MED ORDER — SOD CITRATE-CITRIC ACID 500-334 MG/5ML PO SOLN: 30.0000 mL | ORAL | Status: DC | PRN

## 2019-02-02 MED ORDER — TETANUS-DIPHTH-ACELL PERTUSSIS 5-2.5-18.5 LF-MCG/0.5 IM SUSP: 0.5000 mL | Freq: Once | INTRAMUSCULAR | Status: DC

## 2019-02-02 MED ORDER — WITCH HAZEL-GLYCERIN EX PADS: 1.0000 "application " | MEDICATED_PAD | CUTANEOUS | Status: DC | PRN

## 2019-02-02 MED ORDER — LIDOCAINE HCL (PF) 1 % IJ SOLN
INTRAMUSCULAR | Status: DC | PRN
  Administered 2019-02-02: 3 mL via EPIDURAL

## 2019-02-02 MED ORDER — OXYTOCIN BOLUS FROM INFUSION
500.0000 mL | Freq: Once | INTRAVENOUS | Status: AC
  Administered 2019-02-02: 500 mL via INTRAVENOUS

## 2019-02-02 MED ORDER — PHENYLEPHRINE 40 MCG/ML (10ML) SYRINGE FOR IV PUSH (FOR BLOOD PRESSURE SUPPORT): 80.0000 ug | PREFILLED_SYRINGE | INTRAVENOUS | Status: DC | PRN

## 2019-02-02 MED ORDER — LIDOCAINE HCL (PF) 1 % IJ SOLN: 30.0000 mL | INTRAMUSCULAR | Status: DC | PRN

## 2019-02-02 MED ORDER — LACTATED RINGERS IV SOLN: 500.0000 mL | INTRAVENOUS | Status: DC | PRN

## 2019-02-02 MED ORDER — DIPHENHYDRAMINE HCL 25 MG PO CAPS: 25.0000 mg | ORAL_CAPSULE | Freq: Four times a day (QID) | ORAL | Status: DC | PRN

## 2019-02-02 MED ORDER — OXYCODONE-ACETAMINOPHEN 5-325 MG PO TABS: 1.0000 | ORAL_TABLET | ORAL | Status: DC | PRN

## 2019-02-02 MED ORDER — EPHEDRINE 5 MG/ML INJ: 10.0000 mg | INTRAVENOUS | Status: DC | PRN

## 2019-02-02 MED ORDER — SENNOSIDES-DOCUSATE SODIUM 8.6-50 MG PO TABS
2.0000 | ORAL_TABLET | ORAL | Status: DC
  Administered 2019-02-02: 23:00:00 2 via ORAL
  Filled 2019-02-02: qty 2

## 2019-02-02 MED ORDER — ZOLPIDEM TARTRATE 5 MG PO TABS: 5.0000 mg | ORAL_TABLET | Freq: Every evening | ORAL | Status: DC | PRN

## 2019-02-02 MED ORDER — LACTATED RINGERS IV SOLN
500.0000 mL | Freq: Once | INTRAVENOUS | Status: AC
  Administered 2019-02-02: 11:00:00 500 mL via INTRAVENOUS

## 2019-02-02 MED ORDER — OXYCODONE-ACETAMINOPHEN 5-325 MG PO TABS: 2.0000 | ORAL_TABLET | ORAL | Status: DC | PRN

## 2019-02-02 MED ORDER — LACTATED RINGERS IV SOLN
INTRAVENOUS | Status: DC
  Administered 2019-02-02: 13:00:00 via INTRAVENOUS

## 2019-02-02 MED ORDER — ACETAMINOPHEN 325 MG PO TABS: 650.0000 mg | ORAL_TABLET | ORAL | Status: DC | PRN

## 2019-02-02 MED ORDER — IBUPROFEN 600 MG PO TABS
600.0000 mg | ORAL_TABLET | Freq: Four times a day (QID) | ORAL | Status: DC
  Administered 2019-02-02 – 2019-02-03 (×5): 600 mg via ORAL
  Filled 2019-02-02 (×4): qty 1

## 2019-02-02 MED ORDER — OXYTOCIN 40 UNITS IN NORMAL SALINE INFUSION - SIMPLE MED
2.5000 [IU]/h | INTRAVENOUS | Status: DC
  Administered 2019-02-02: 16:00:00 2.5 [IU]/h via INTRAVENOUS
  Filled 2019-02-02: qty 1000

## 2019-02-02 MED ORDER — BUPIVACAINE HCL (PF) 0.25 % IJ SOLN
INTRAMUSCULAR | Status: DC | PRN
  Administered 2019-02-02: 1.4 mL via INTRATHECAL

## 2019-02-02 MED ORDER — FENTANYL-BUPIVACAINE-NACL 0.5-0.125-0.9 MG/250ML-% EP SOLN
EPIDURAL | Status: AC
  Filled 2019-02-02: qty 250

## 2019-02-02 MED ORDER — DIBUCAINE (PERIANAL) 1 % EX OINT: 1.0000 "application " | TOPICAL_OINTMENT | CUTANEOUS | Status: DC | PRN

## 2019-02-02 MED ORDER — ONDANSETRON HCL 4 MG/2ML IJ SOLN: 4.0000 mg | Freq: Four times a day (QID) | INTRAMUSCULAR | Status: DC | PRN

## 2019-02-02 MED ORDER — FENTANYL CITRATE (PF) 100 MCG/2ML IJ SOLN: 100.0000 ug | INTRAMUSCULAR | Status: DC | PRN

## 2019-02-02 MED ORDER — PRENATAL MULTIVITAMIN CH
1.0000 | ORAL_TABLET | Freq: Every day | ORAL | Status: DC
  Administered 2019-02-03: 12:00:00 1 via ORAL
  Filled 2019-02-02: qty 1

## 2019-02-02 MED ORDER — FENTANYL-BUPIVACAINE-NACL 0.5-0.125-0.9 MG/250ML-% EP SOLN: 12.0000 mL/h | EPIDURAL | Status: DC | PRN

## 2019-02-02 MED ORDER — SIMETHICONE 80 MG PO CHEW
80.0000 mg | CHEWABLE_TABLET | ORAL | Status: DC | PRN
  Administered 2019-02-02: 80 mg via ORAL
  Filled 2019-02-02: qty 1

## 2019-02-02 NOTE — Discharge Summary (Signed)
Postpartum Discharge Summary     Patient Name: Brandi Austin DOB: [birth year] MRN: [id number]  Date of admission: 02/02/2019 Delivering Provider: Wende Mott   Date of discharge: 02/03/2019  Admitting diagnosis: CTX less than 3. Intrauterine pregnancy: [redacted]w[redacted]d     Secondary diagnosis:  Active Problems:   Normal labor   Term newborn delivered vaginally, current hospitalization  Additional problems: n/a     Discharge diagnosis: Term Pregnancy Delivered                                                                                                Post partum procedures:n/a  Augmentation: AROM  Complications: None  Hospital course:  Onset of Labor With Vaginal Delivery     28 y.o. yo [id number] at [redacted]w[redacted]d was admitted in Active Labor on 02/02/2019. Patient had an uncomplicated labor course as follows:  Membrane Rupture Time/Date: 2:52 PM ,02/02/2019   Intrapartum Procedures: Episiotomy: None [1]                                         Lacerations:  1st degree [2];Perineal [11]  Patient had a delivery of a Viable infant. 02/02/2019  Information for the patient's newborn:  Sallyanne Havers, Girl Maansi [[id number]]  Delivery Method: Vaginal, Spontaneous(Filed from Delivery Summary)     Pateint had an uncomplicated postpartum course.  She is ambulating, tolerating a regular diet, passing flatus, and urinating well. Patient is discharged home in stable condition on 02/03/19.   Magnesium Sulfate recieved: No BMZ received: No  Physical exam  Vitals:   02/02/19 2200 02/03/19 0200 02/03/19 0600 02/03/19 1252  BP: (!) 121/56 111/66 (!) 93/57 (!) 103/58  Pulse: 71 67 72 73  Resp: 18 16 18 18   Temp: 98.2 F (36.8 C) 98.2 F (36.8 C) 97.9 F (36.6 C) 98.8 F (37.1 C)  TempSrc: Oral Oral Oral Oral  SpO2: 99% 99% 98%   Weight:      Height:       General: alert, cooperative and no distress Lochia: appropriate Uterine Fundus: firm Incision: N/A DVT Evaluation: No  evidence of DVT seen on physical exam. Labs: Lab Results  Component Value Date   WBC 9.1 02/03/2019   HGB 9.8 (L) 02/03/2019   HCT 31.1 (L) 02/03/2019   MCV 82.5 02/03/2019   PLT 179 02/03/2019   CMP Latest Ref Rng & Units 03/12/2018  Glucose 70 - 99 mg/dL 105(H)  BUN 6 - 20 mg/dL 12  Creatinine 0.44 - 1.00 mg/dL 0.65  Sodium 135 - 145 mmol/L 137  Potassium 3.5 - 5.1 mmol/L 3.8  Chloride 98 - 111 mmol/L 106  CO2 22 - 32 mmol/L 24  Calcium 8.9 - 10.3 mg/dL 8.9  Total Protein 6.5 - 8.1 g/dL 6.9  Total Bilirubin 0.3 - 1.2 mg/dL 0.4  Alkaline Phos 38 - 126 U/L 75  AST 15 - 41 U/L 24  ALT 0 - 44 U/L 27    Discharge instruction: per After Visit Summary and "Baby and  Me Booklet".  After visit meds:  Allergies as of 02/03/2019   No Known Allergies     Medication List    STOP taking these medications   dicyclomine 20 MG tablet Commonly known as: BENTYL   ondansetron 4 MG disintegrating tablet Commonly known as: Zofran ODT     TAKE these medications   acetaminophen 325 MG tablet Commonly known as: Tylenol Take 2 tablets (650 mg total) by mouth every 6 (six) hours as needed (for pain scale < 4).   ibuprofen 600 MG tablet Commonly known as: ADVIL Take 1 tablet (600 mg total) by mouth every 8 (eight) hours as needed.   multivitamin tablet Take 1 tablet by mouth daily.   PNV PO Take by mouth.       Diet: routine diet  Activity: Advance as tolerated. Pelvic rest for 6 weeks.   Outpatient follow up:4 weeks Follow up Appt:No future appointments. Follow up Visit:  [facility] patient    Newborn Data: APGAR (1 MIN): 8   APGAR (5 MINS): 9   APGAR (10 MINS):    Newborn Delivery   Birth date/time: 02/02/2019 15:08:00 Delivery type: Vaginal, Spontaneous      Baby Feeding: Breast Disposition:home with mother

## 2019-02-02 NOTE — MAU Note (Signed)
Brandi Austin is a 28 y.o. at [redacted]w[redacted]d here in MAU reporting: woke up to contractions this AM around 0710, they are every 3-5 min. Had some pink spotting on a pad. No LOF. +FM  Onset of complaint: today  Pain score: 6/10  Vitals:   02/02/19 0829  BP: 114/77  Pulse: 89  Resp: 18  Temp: 99.2 F (37.3 C)  SpO2: 100%     FHT: +FM  Lab orders placed from triage: none

## 2019-02-02 NOTE — MAU Note (Signed)
Pt denies any covid symptoms, swab collected 

## 2019-02-02 NOTE — Anesthesia Procedure Notes (Signed)
Combined Spinal-Epidural Patient location during procedure: OB Start time: 02/02/2019 10:54 AM End time: 02/02/2019 11:05 AM  Staffing Anesthesiologist: Lidia Collum, MD Performed: anesthesiologist   Preanesthetic Checklist Completed: patient identified, pre-op evaluation, timeout performed, IV checked, risks and benefits discussed and monitors and equipment checked  Epidural Patient position: sitting Prep: DuraPrep Patient monitoring: heart rate, continuous pulse ox and blood pressure Approach: midline Location: L3-L4 Injection technique: LOR air  Needle:  Needle type: Tuohy (+ 12.7 cm 25g Pencan spinal needle)  Needle gauge: 17 G Needle length: 9 cm Needle insertion depth: 6 cm Catheter type: closed end flexible Catheter size: 19 Gauge Catheter at skin depth: 11 cm  Assessment Events: blood not aspirated, injection not painful, no injection resistance, negative IV test and no paresthesia  Additional Notes The patient was prepped and draped in the usual sterile fashion. A combined spinal-epidural was performed using a 9 cm 17g Tuohy needle and loss of resistance technique. After encountering LOR, a 12.7 cm 25g Pencan spinal needle was introduced via the Tuohy and clear CSF was aspirated prior to injection of local anesthetic. The spinal needle was removed and a 19 g flexible epidural catheter placed prior to Tuohy removal. Patient tolerated the procedure well without complications.Reason for block:procedure for pain

## 2019-02-02 NOTE — H&P (Signed)
Brandi Austin is a 28 y.o. female presenting for SOL at term. She endorses recurrent painful lower abdominal contractions which began this morning around 0700. She endorses scant pink spotting but denies frank vaginal bleeding, LOF, DFM.  Prenatal History --GCHD --Dating by LMP c/w early [place] --Low Risk NIPS --Rubella Immune --Varicella Immune --TDAP 11/21/18  OB History    Gravida  4   Para  2   Term  2   Preterm      AB  1   Living  2     SAB  1   TAB      Ectopic      Multiple      Live Births  2          Past Medical History:  Diagnosis Date  . Chlamydia   . Complete abortion 07/27/2011   Past Surgical History:  Procedure Laterality Date  . left elbow surgery     Family History: family history includes Healthy in her father and mother. Social History:  reports that she has never smoked. She has never used smokeless tobacco. She reports that she does not drink alcohol or use drugs.     Maternal Diabetes: No Genetic Screening: Normal Maternal Ultrasounds/Referrals: Normal Fetal Ultrasounds or other Referrals:  None Maternal Substance Abuse:  No Significant Maternal Medications:  None Significant Maternal Lab Results:  Group B Strep negative Other Comments:  None  Review of Systems  Constitutional: Negative for fever.  Respiratory: Negative for shortness of breath.   Cardiovascular: Negative for chest pain.  Gastrointestinal: Positive for abdominal pain.  Musculoskeletal: Negative for back pain.  All other systems reviewed and are negative.  Dilation: 6 Effacement (%): 70 Station: -2 Exam by:: A. Gagliardo, RN Blood pressure 114/77, pulse 89, temperature 99.2 F (37.3 C), temperature source Oral, resp. rate 18, height 5' (1.524 m), weight 82.3 kg, last menstrual period 05/09/2018, SpO2 100 %, unknown if currently breastfeeding. Physical Exam  Nursing note and vitals reviewed. Constitutional: She is oriented to person, place, and  time. She appears well-developed and well-nourished.  Cardiovascular: Normal rate.  Respiratory: Effort normal and breath sounds normal. No respiratory distress.  GI: She exhibits no distension. There is no abdominal tenderness. There is no rebound and no guarding.  Gravid, soft between contractions  Musculoskeletal: Normal range of motion.  Neurological: She is alert and oriented to person, place, and time.  Skin: Skin is warm and dry.  Psychiatric: She has a normal mood and affect. Her behavior is normal. Judgment and thought content normal.    Prenatal labs (admission labs collected): ABO, Rh:  O POS Antibody:  Neg Rubella:  Immune RPR:   NR HBsAg:   Neg HIV:   Neg GBS: Negative (08/10 0000)   Fetal Surveillance --Category I --Baseline 145, mod variability, positive accels, no decels --Toco: irregular ctx q 2-4 min, palpate moderate to strong --Vertex by suture per RN exam  Assessment/Plan: --28 y.o. [id number] at [redacted]w[redacted]d  --Category I tracing --SOL, cervical change 4-6cm in MAU per RN --GBS NEG --Desires epidural --Girl/breast/contraception undecided --Admit to L&D, report called to C. Maryruth Hancock, [place], [place] 02/02/2019, 10:11 AM

## 2019-02-02 NOTE — Anesthesia Preprocedure Evaluation (Signed)

## 2019-02-03 LAB — CBC
HCT: 31.1 % — ABNORMAL LOW (ref 36.0–46.0)
Hemoglobin: 9.8 g/dL — ABNORMAL LOW (ref 12.0–15.0)
MCH: 26 pg (ref 26.0–34.0)
MCHC: 31.5 g/dL (ref 30.0–36.0)
MCV: 82.5 fL (ref 80.0–100.0)
Platelets: 179 10*3/uL (ref 150–400)
RBC: 3.77 MIL/uL — ABNORMAL LOW (ref 3.87–5.11)
RDW: 15.4 % (ref 11.5–15.5)
WBC: 9.1 10*3/uL (ref 4.0–10.5)
nRBC: 0 % (ref 0.0–0.2)

## 2019-02-03 MED ORDER — ACETAMINOPHEN 325 MG PO TABS: 650.0000 mg | ORAL_TABLET | Freq: Four times a day (QID) | ORAL | 0 refills | Status: DC | PRN

## 2019-02-03 MED ORDER — IBUPROFEN 600 MG PO TABS: 600.0000 mg | ORAL_TABLET | Freq: Three times a day (TID) | ORAL | 0 refills | Status: DC | PRN

## 2019-02-03 NOTE — Lactation Note (Signed)
This note was copied from a baby's chart. Lactation Consultation Note  Patient Name: Brandi Austin Date: 02/03/2019   [facility] entered room.  Mom states infant is doing very well at the breast she feels like.  She has no concerns regarding feeding.  LC provided mom with a hand pump and discussed supply and demand, engorgement prevention, and cluster feeding.    LC provided bfsg information as well as phone number to call for lactation questions or concerns, or OP lactation appointment.  Mom denies further needs.  Maternal Data    Feeding Feeding Type: Breast Fed  LATCH Score                   Interventions    Lactation Tools Discussed/Used     Consult Status      Ferne Coe [facility] 02/03/2019, 5:55 PM

## 2019-02-03 NOTE — Anesthesia Postprocedure Evaluation (Signed)
Anesthesia Post Note  Patient: Brandi Austin  Procedure(s) Performed: AN AD [place]     Patient location during evaluation: Mother Baby Anesthesia Type: Epidural Level of consciousness: awake Pain management: satisfactory to patient Vital Signs Assessment: post-procedure vital signs reviewed and stable Respiratory status: spontaneous breathing Cardiovascular status: stable Anesthetic complications: no    Last Vitals:  Vitals:   02/03/19 0200 02/03/19 0600  BP: 111/66 (!) 93/57  Pulse: 67 72  Resp: 16 18  Temp: 36.8 C 36.6 C  SpO2: 99% 98%    Last Pain:  Vitals:   02/03/19 0600  TempSrc: Oral  PainSc: 2    Pain Goal: Patients Stated Pain Goal: 4 (02/02/19 1030)                 Casimer Lanius

## 2019-02-03 NOTE — Progress Notes (Addendum)
POSTPARTUM PROGRESS NOTE  Post Partum Day 1  Subjective:  Brandi Austin is a 28 y.o. [phone] s/p SVD at [redacted]w[redacted]d.  She reports she is doing well. No acute events overnight. She denies any problems with ambulating, voiding or po intake. Denies nausea or vomiting. Denies dizziness or lightheadedness. Pain is well controlled, but still experiencing some intermittent lower abdominal cramping.  Lochia is appropriate (bloody).  Objective: Blood pressure (!) 93/57, pulse 72, temperature 97.9 F (36.6 C), temperature source Oral, resp. rate 18, height 5' (1.524 m), weight 82.3 kg, last menstrual period 05/09/2018, SpO2 98 %, unknown if currently breastfeeding.  Physical Exam:  General: alert, cooperative and no distress Chest: no respiratory distress. CTAB Heart:regular rate, distal pulses intact Abdomen: soft, nontender,  Uterine Fundus: firm, appropriately tender DVT Evaluation: No calf swelling or tenderness Extremities: No edema Skin: warm, dry  Recent Labs    02/02/19 1014 02/03/19 0531  HGB 11.9* 9.8*  HCT 37.5 31.1*    Assessment/Plan: Brandi Austin is a 28 y.o. [phone] s/p SVD at [redacted]w[redacted]d   PPD#1 - Doing well  Routine postpartum care Anemia:Hgb 9.8. Asymptomatic.              Continue to monitor Hypotension:BP 93/57 this AM. Asymptomatic.                     Continue to monitor. Contraception: condoms Feeding: Breast/supplement with bottle Dispo: Plan for discharge 8/25.   LOS: 1 day   [place] Student 02/03/2019, 7:49 AM  I saw and evaluated the patient. I agree with the findings and the plan of care as documented in the resident's note. Vitals stable. Pain well-controlled. Minimal bleeding. Possible DC later this afternoon if baby is able to discharge. Will place orders at RN request.   Barrington Ellison, MD [facility] Fellow, [facility] for [facility], [place]

## 2019-02-03 NOTE — Lactation Note (Signed)
This note was copied from a baby's chart. Lactation Consultation Note Baby 36 hrs old. Mom states BF good. Mom BF her 6 and 26 yr old for about 6 months w/o difficulty. Baby has tubular breast approximately 2 fingers width between breast. Baby in latched in cradle position, wide flange feeding well. Reviewed newborn feeding habits. Mom denies any questions or concerns. Encouraged mom to call for assistance as needed. Newborn feeding habits, STS, I&O supply and demand discussed. Mom denies need for assistance if right now. Lactation brochure given, encouraged to call for assistance or questions. Lactation brochure given.             Patient Name: Brandi Austin [id number] Date: 02/03/2019 Reason for consult: Initial assessment   Maternal Data Has patient been taught Hand Expression?: Yes Does the patient have breastfeeding experience prior to this delivery?: Yes  Feeding Feeding Type: Breast Fed  LATCH Score Latch: Grasps breast easily, tongue down, lips flanged, rhythmical sucking.  Audible Swallowing: Spontaneous and intermittent  Type of Nipple: Everted at rest and after stimulation  Comfort (Breast/Nipple): Filling, red/small blisters or bruises, mild/mod discomfort(getting sore)  Hold (Positioning): No assistance needed to correctly position infant at breast.  LATCH Score: 9  Interventions Interventions: Breast feeding basics reviewed;Coconut oil;Breast compression  Lactation Tools Discussed/Used WIC Program: Yes   Consult Status Consult Status: Follow-up Date: 02/04/19 Follow-up type: In-patient    Theodoro Kalata 02/03/2019, 4:13 AM

## 2019-02-04 LAB — RPR: RPR Ser Ql: NONREACTIVE

## 2019-06-25 ENCOUNTER — Other Ambulatory Visit: Payer: Self-pay

## 2020-04-20 ENCOUNTER — Emergency Department (HOSPITAL_COMMUNITY)
Admission: EM | Admit: 2020-04-20 | Discharge: 2020-04-20 | Disposition: A | Discharge status: Home / Self Care | Payer: Self-pay | Attending: Emergency Medicine | Admitting: Emergency Medicine

## 2020-04-20 ENCOUNTER — Encounter (HOSPITAL_COMMUNITY): Payer: Self-pay | Admitting: Emergency Medicine

## 2020-04-20 ENCOUNTER — Other Ambulatory Visit: Payer: Self-pay

## 2020-04-20 DIAGNOSIS — N390 Urinary tract infection, site not specified: Secondary | ICD-10-CM

## 2020-04-20 DIAGNOSIS — R112 Nausea with vomiting, unspecified: Secondary | ICD-10-CM | POA: Insufficient documentation

## 2020-04-20 LAB — URINALYSIS, ROUTINE W REFLEX MICROSCOPIC
Bilirubin Urine: NEGATIVE
Glucose, UA: NEGATIVE mg/dL
Hgb urine dipstick: NEGATIVE
Ketones, ur: 5 mg/dL — AB
Nitrite: NEGATIVE
Protein, ur: 30 mg/dL — AB
Specific Gravity, Urine: 1.025 (ref 1.005–1.030)
pH: 7 (ref 5.0–8.0)

## 2020-04-20 LAB — CBC
HCT: 42.9 % (ref 36.0–46.0)
Hemoglobin: 13.5 g/dL (ref 12.0–15.0)
MCH: 26 pg (ref 26.0–34.0)
MCHC: 31.5 g/dL (ref 30.0–36.0)
MCV: 82.5 fL (ref 80.0–100.0)
Platelets: 213 10*3/uL (ref 150–400)
RBC: 5.2 MIL/uL — ABNORMAL HIGH (ref 3.87–5.11)
RDW: 14.7 % (ref 11.5–15.5)
WBC: 13.6 10*3/uL — ABNORMAL HIGH (ref 4.0–10.5)
nRBC: 0 % (ref 0.0–0.2)

## 2020-04-20 LAB — COMPREHENSIVE METABOLIC PANEL
ALT: 28 U/L (ref 0–44)
AST: 23 U/L (ref 15–41)
Albumin: 4.2 g/dL (ref 3.5–5.0)
Alkaline Phosphatase: 72 U/L (ref 38–126)
Anion gap: 10 (ref 5–15)
BUN: 13 mg/dL (ref 6–20)
CO2: 24 mmol/L (ref 22–32)
Calcium: 9.4 mg/dL (ref 8.9–10.3)
Chloride: 104 mmol/L (ref 98–111)
Creatinine, Ser: 0.62 mg/dL (ref 0.44–1.00)
GFR, Estimated: >60 mL/min (ref >60)
Glucose, Bld: 120 mg/dL — ABNORMAL HIGH (ref 70–99)
Potassium: 3.5 mmol/L (ref 3.5–5.1)
Sodium: 138 mmol/L (ref 135–145)
Total Bilirubin: 0.5 mg/dL (ref 0.3–1.2)
Total Protein: 7.9 g/dL (ref 6.5–8.1)

## 2020-04-20 LAB — I-STAT BETA HCG BLOOD, ED (MC, WL, AP ONLY): I-stat hCG, quantitative: <5 m[IU]/mL (ref <5)

## 2020-04-20 LAB — LIPASE, BLOOD: Lipase: 30 U/L (ref 11–51)

## 2020-04-20 MED ORDER — ONDANSETRON HCL 4 MG/2ML IJ SOLN
4.0000 mg | Freq: Once | INTRAMUSCULAR | Status: AC
  Administered 2020-04-20: 4 mg via INTRAVENOUS
  Filled 2020-04-20: qty 2

## 2020-04-20 MED ORDER — ONDANSETRON 4 MG PO TBDP: ORAL_TABLET | ORAL | 0 refills | Status: DC

## 2020-04-20 MED ORDER — CEPHALEXIN 500 MG PO CAPS: 500.0000 mg | ORAL_CAPSULE | Freq: Three times a day (TID) | ORAL | 0 refills | Status: DC

## 2020-04-20 MED ORDER — SODIUM CHLORIDE 0.9 % IV SOLN
1.0000 g | Freq: Once | INTRAVENOUS | Status: AC
  Administered 2020-04-20: 1 g via INTRAVENOUS
  Filled 2020-04-20: qty 10

## 2020-04-20 MED ORDER — SODIUM CHLORIDE 0.9 % IV BOLUS
1000.0000 mL | Freq: Once | INTRAVENOUS | Status: AC
  Administered 2020-04-20: 1000 mL via INTRAVENOUS

## 2020-04-20 NOTE — ED Triage Notes (Signed)
Patient arrives to ED with complaints of emesis starting at 10am this morning. Pt states between 10-15 episodes. Pt denies fever, diarrhea, dysuria. Pt denies abdominal pain or cramping but endorses constant nausea.

## 2020-04-20 NOTE — ED Provider Notes (Signed)
[facility] EMERGENCY DEPARTMENT Provider Note   CSN: [id number] Arrival date & time: 04/20/20  1726     History Chief Complaint  Patient presents with  . Emesis    Brandi Austin is a 29 y.o. female here presenting with vomiting. Patient states that she started vomiting around 10 AM this morning. Vomited more than 10 episodes as nonbloody and nonbilious. Patient has no dysuria but she has had some nausea and diffuse cramps. Denies any sick contacts. She states that she did not eat anything spicy or ate bad food. Husband at bedside and states that he is feeling fine and is no sick contacts. Denies any fevers.  The history is provided by the patient.       Past Medical History:  Diagnosis Date  . Chlamydia   . Complete abortion 07/27/2011    Patient Active Problem List   Diagnosis Date Noted  . Term newborn delivered vaginally, current hospitalization 02/03/2019  . Normal labor 02/02/2019  . Complete abortion 07/27/2011  . Chlamydia 07/27/2011    Past Surgical History:  Procedure Laterality Date  . left elbow surgery       OB History    Gravida  4   Para  3   Term  3   Preterm      AB  1   Living  3     SAB  1   TAB      Ectopic      Multiple  0   Live Births  3           Family History  Problem Relation Age of Onset  . Healthy Father   . Healthy Mother   . Anesthesia problems Neg Hx     Social History   Tobacco Use  . Smoking status: Never Smoker  . Smokeless tobacco: Never Used  Vaping Use  . Vaping Use: Never used  Substance Use Topics  . Alcohol use: No    Comment: prior to pregnancy  . Drug use: No    Home Medications Prior to Admission medications   Medication Sig Start Date End Date Taking? Authorizing Provider  acetaminophen (TYLENOL) 325 MG tablet Take 2 tablets (650 mg total) by mouth every 6 (six) hours as needed (for pain scale < 4). 02/03/19   Fair, Hoyle Sauer, MD  ibuprofen  (ADVIL) 600 MG tablet Take 1 tablet (600 mg total) by mouth every 8 (eight) hours as needed. 02/03/19   FairHoyle Sauer, MD  Multiple Vitamin (MULTIVITAMIN) tablet Take 1 tablet by mouth daily.    [provider]  Prenatal Vit w/Fe-Methylfol-FA (PNV PO) Take by mouth.    [provider]    Allergies    Patient has no known allergies.  Review of Systems   Review of Systems  Gastrointestinal: Positive for vomiting.  All other systems reviewed and are negative.   Physical Exam Updated Vital Signs BP 114/76   Pulse 70   Temp 98.8 F (37.1 C) (Oral)   Resp 16   Ht 5' (1.524 m)   Wt 77.1 kg   SpO2 99%   BMI 33.20 kg/m   Physical Exam Vitals and nursing note reviewed.  Constitutional:      Appearance: Normal appearance.  HENT:     Head: Normocephalic.     Nose: Nose normal.     Mouth/Throat:     Mouth: Mucous membranes are moist.  Eyes:     Extraocular Movements: Extraocular movements  intact.     Pupils: Pupils are equal, round, and reactive to light.  Cardiovascular:     Rate and Rhythm: Normal rate and regular rhythm.     Pulses: Normal pulses.     Heart sounds: Normal heart sounds.  Pulmonary:     Effort: Pulmonary effort is normal.     Breath sounds: Normal breath sounds.  Abdominal:     General: Abdomen is flat.     Palpations: Abdomen is soft.  Musculoskeletal:        General: Normal range of motion.     Cervical back: Normal range of motion and neck supple.  Skin:    General: Skin is warm.     Capillary Refill: Capillary refill takes less than 2 seconds.  Neurological:     General: No focal deficit present.     Mental Status: She is alert and oriented to person, place, and time.  Psychiatric:        Mood and Affect: Mood normal.        Behavior: Behavior normal.     ED Results / Procedures / Treatments   Labs (all labs ordered are listed, but only abnormal results are displayed) Labs Reviewed  COMPREHENSIVE METABOLIC PANEL -  Abnormal; Notable for the following components:      Result Value   Glucose, Bld 120 (*)    All other components within normal limits  CBC - Abnormal; Notable for the following components:   WBC 13.6 (*)    RBC 5.20 (*)    All other components within normal limits  URINALYSIS, ROUTINE W REFLEX MICROSCOPIC - Abnormal; Notable for the following components:   Color, Urine AMBER (*)    APPearance HAZY (*)    Ketones, ur 5 (*)    Protein, ur 30 (*)    Leukocytes,Ua SMALL (*)    Bacteria, UA MANY (*)    All other components within normal limits  LIPASE, BLOOD  I-STAT BETA HCG BLOOD, ED ([facility], [facility], [facility] ONLY)    EKG None  Radiology No results found.  Procedures Procedures (including critical care time)  Medications Ordered in ED Medications  cefTRIAXone (ROCEPHIN) 1 g in sodium chloride 0.9 % 100 mL IVPB (has no administration in time range)  sodium chloride 0.9 % bolus 1,000 mL (1,000 mLs Intravenous New Bag/Given 04/20/20 1942)  ondansetron (ZOFRAN) injection 4 mg (4 mg Intravenous Given 04/20/20 1942)    ED Course  I have reviewed the triage vital signs and the nursing notes.  Pertinent labs & imaging results that were available during my care of the patient were reviewed by me and considered in my medical decision making (see chart for details).    MDM Rules/Calculators/A&P                         Brandi Austin is a 29 y.o. female who presenting with vomiting. Likely viral gastroenteritis versus UTI. Patient states that she had 5 pregnancies before and wants to make sure she is not pregnant. She has no pelvic pain or abdominal tenderness. Will get cbc, CMP, lipase, UA, pregnancy test. Will hydrate and reassess.    8:44 PM Pregnancy test is negative.  UA showed UTI.  Chemistry unremarkable.  Given IV fluids and Zofran and felt better.  Also given Rocephin for UTI.  Will discharge home with Keflex and Zofran.  Final Clinical Impression(s) / ED Diagnoses Final  diagnoses:  None    Rx /  DC Orders ED Discharge Orders    None       Charlynne Pander, MD 04/20/20 2044

## 2020-04-20 NOTE — Discharge Instructions (Signed)
Stay hydrated.   Take Zofran for nausea.  Take Keflex 3 times a day for 5 days for UTI  See your doctor for follow-up  Return to ER if you have abdominal pain, vomiting, fever

## 2020-08-26 ENCOUNTER — Other Ambulatory Visit: Payer: Self-pay

## 2020-08-26 ENCOUNTER — Inpatient Hospital Stay (HOSPITAL_COMMUNITY)
Admission: AD | Admit: 2020-08-26 | Discharge: 2020-08-26 | Disposition: A | Discharge status: Home / Self Care | Payer: Self-pay | Attending: Obstetrics and Gynecology | Admitting: Obstetrics and Gynecology

## 2020-08-26 ENCOUNTER — Inpatient Hospital Stay (HOSPITAL_COMMUNITY): Payer: Self-pay

## 2020-08-26 ENCOUNTER — Encounter (HOSPITAL_COMMUNITY): Payer: Self-pay | Admitting: Obstetrics and Gynecology

## 2020-08-26 DIAGNOSIS — O0380 Unspecified complication following complete or unspecified spontaneous abortion: Secondary | ICD-10-CM

## 2020-08-26 DIAGNOSIS — R109 Unspecified abdominal pain: Secondary | ICD-10-CM | POA: Insufficient documentation

## 2020-08-26 DIAGNOSIS — O046 Delayed or excessive hemorrhage following (induced) termination of pregnancy: Secondary | ICD-10-CM

## 2020-08-26 DIAGNOSIS — O021 Missed abortion: Secondary | ICD-10-CM

## 2020-08-26 DIAGNOSIS — Z3A11 11 weeks gestation of pregnancy: Secondary | ICD-10-CM | POA: Insufficient documentation

## 2020-08-26 DIAGNOSIS — O0489 (Induced) termination of pregnancy with other complications: Secondary | ICD-10-CM

## 2020-08-26 DIAGNOSIS — O26891 Other specified pregnancy related conditions, first trimester: Secondary | ICD-10-CM | POA: Insufficient documentation

## 2020-08-26 DIAGNOSIS — Z6740 Type O blood, Rh positive: Secondary | ICD-10-CM

## 2020-08-26 DIAGNOSIS — O4690 Antepartum hemorrhage, unspecified, unspecified trimester: Secondary | ICD-10-CM

## 2020-08-26 DIAGNOSIS — M549 Dorsalgia, unspecified: Secondary | ICD-10-CM | POA: Insufficient documentation

## 2020-08-26 LAB — URINALYSIS, ROUTINE W REFLEX MICROSCOPIC
Bacteria, UA: NONE SEEN
Bilirubin Urine: NEGATIVE
Glucose, UA: NEGATIVE mg/dL
Ketones, ur: NEGATIVE mg/dL
Leukocytes,Ua: NEGATIVE
Nitrite: NEGATIVE
Protein, ur: NEGATIVE mg/dL
Specific Gravity, Urine: 1.021 (ref 1.005–1.030)
pH: 6 (ref 5.0–8.0)

## 2020-08-26 LAB — HCG, QUANTITATIVE, PREGNANCY: hCG, Beta Chain, Quant, S: 329 m[IU]/mL — ABNORMAL HIGH (ref <5)

## 2020-08-26 MED ORDER — IBUPROFEN 600 MG PO TABS: 600.0000 mg | ORAL_TABLET | Freq: Three times a day (TID) | ORAL | 0 refills | Status: DC | PRN

## 2020-08-26 MED ORDER — OXYCODONE-ACETAMINOPHEN 5-325 MG PO TABS
1.0000 | ORAL_TABLET | ORAL | 0 refills | Status: DC | PRN
Start: 2020-08-26 — End: 2021-01-06

## 2020-08-26 MED ORDER — MISOPROSTOL 200 MCG PO TABS: 800.0000 ug | ORAL_TABLET | Freq: Once | ORAL | 0 refills | Status: DC

## 2020-08-26 NOTE — MAU Note (Signed)
Brandi Austin is a 30 y.o. at [redacted]w[redacted]d here in MAU reporting: states she found out she was pregnant and then on feb 13 took pills to terminate the pregnancy. Then 2 weeks ago she started feeling sick again and she has continued to have positive pregnancy tests. States she went to the [facility] today for pregnancy confirmation to see if their test was also positive and it was ( pt has letter with her). Has continued to have bleeding daily. Having cramping and back pain.  LMP: 06/06/20  Onset of complaint: ongoing  Pain score: 3/10  Vitals:   08/26/20 1755  BP: (!) 123/58  Pulse: 73  Resp: 16  Temp: 98.3 F (36.8 C)  SpO2: 100%     Lab orders placed from triage:UA

## 2020-08-26 NOTE — MAU Provider Note (Signed)
History     CSN: [id number]  Arrival date and time: 08/26/20 1739   Event Date/Time   First Provider Initiated Contact with Patient 08/26/20 1819      Chief Complaint  Patient presents with  . Back Pain  . Vaginal Bleeding   HPI  Ms.Ambrose Desired [name] is a 30 y.o. female [id number] female here with vaginal bleeding and back pain. She reports a medical termination 1 month ago with [facility]. She did not f/u with [facility]. She has continued to have light bleeding for the past month. She did have an episode of heavy bleeding after the cycotec.  She took a pregnancy test and it was positive. The bleeding now is minimal.   OB History    Gravida  5   Para  3   Term  3   Preterm      AB  1   Living  3     SAB  1   IAB      Ectopic      Multiple  0   Live Births  3           Past Medical History:  Diagnosis Date  . Chlamydia   . Complete abortion 07/27/2011    Past Surgical History:  Procedure Laterality Date  . left elbow surgery      Family History  Problem Relation Age of Onset  . Healthy Father   . Healthy Mother   . Anesthesia problems Neg Hx     Social History   Tobacco Use  . Smoking status: Never Smoker  . Smokeless tobacco: Never Used  Vaping Use  . Vaping Use: Some days  Substance Use Topics  . Alcohol use: No    Comment: prior to pregnancy  . Drug use: No    Allergies: No Known Allergies  Medications Prior to Admission  Medication Sig Dispense Refill Last Dose  . acetaminophen (TYLENOL) 325 MG tablet Take 2 tablets (650 mg total) by mouth every 6 (six) hours as needed (for pain scale < 4). 30 tablet 0   . cephALEXin (KEFLEX) 500 MG capsule Take 1 capsule (500 mg total) by mouth 3 (three) times daily. 15 capsule 0   . ibuprofen (ADVIL) 600 MG tablet Take 1 tablet (600 mg total) by mouth every 8 (eight) hours as needed. 30 tablet 0   . Multiple Vitamin (MULTIVITAMIN) tablet Take 1 tablet by mouth daily.      . ondansetron (ZOFRAN ODT) 4 MG disintegrating tablet 4mg  ODT q4 hours prn nausea/vomit 10 tablet 0   . Prenatal Vit w/Fe-Methylfol-FA (PNV PO) Take by mouth.      Results for orders placed or performed during the hospital encounter of 08/26/20 (from the past 72 hour(s))  Urinalysis, Routine w reflex microscopic Urine, Clean Catch     Status: Abnormal   Collection Time: 08/26/20  6:43 PM  Result Value Ref Range   Color, Urine YELLOW YELLOW   APPearance CLEAR CLEAR   Specific Gravity, Urine 1.021 1.005 - 1.030   pH 6.0 5.0 - 8.0   Glucose, UA NEGATIVE NEGATIVE mg/dL   Hgb urine dipstick MODERATE (A) NEGATIVE   Bilirubin Urine NEGATIVE NEGATIVE   Ketones, ur NEGATIVE NEGATIVE mg/dL   Protein, ur NEGATIVE NEGATIVE mg/dL   Nitrite NEGATIVE NEGATIVE   Leukocytes,Ua NEGATIVE NEGATIVE   RBC / HPF 11-20 0 - 5 RBC/hpf   WBC, UA 0-5 0 - 5 WBC/hpf   Bacteria, UA NONE SEEN  NONE SEEN   Squamous Epithelial / LPF 0-5 0 - 5   Mucus PRESENT     Comment: Performed at [facility], [address] [place], [place] [address]  hCG, quantitative, pregnancy     Status: Abnormal   Collection Time: 08/26/20  7:06 PM  Result Value Ref Range   hCG, Beta Chain, Quant, S 329 (H) <5 mIU/mL    Comment:          GEST. AGE      CONC.  (mIU/mL)   <=1 WEEK        5 - 50     2 WEEKS       50 - 500     3 WEEKS       100 - 10,000     4 WEEKS     1,000 - 30,000     5 WEEKS     3,500 - 115,000   6-8 WEEKS     12,000 - 270,000    12 WEEKS     15,000 - 220,000        FEMALE AND NON-PREGNANT FEMALE:     LESS THAN 5 mIU/mL Performed at [facility], [address] [place], [place] [address]    Review of Systems  Constitutional: Negative for fever.  Gastrointestinal: Negative for abdominal pain.  Genitourinary: Positive for vaginal bleeding.  Musculoskeletal: Positive for back pain.   Physical Exam   Blood pressure (!) 123/58, pulse 73, temperature 98.3 F (36.8 C), temperature source Oral, resp.  rate 16, height 5' (1.524 m), weight 74.9 kg, last menstrual period 06/06/2020, SpO2 100 %, unknown if currently breastfeeding.  Physical Exam Constitutional:      General: She is not in acute distress.    Appearance: Normal appearance. She is normal weight. She is not ill-appearing, toxic-appearing or diaphoretic.  HENT:     Head: Normocephalic.  Abdominal:     Palpations: Abdomen is soft.     Tenderness: There is no abdominal tenderness.  Genitourinary:    Comments: Cervix anterior, closed. No CMT Exam by Venia Carbon NP  Neurological:     Mental Status: She is alert and oriented to person, place, and time.  Psychiatric:        Behavior: Behavior normal.    MAU Course  Procedures  None   MDM  O positive blood type. Retained POC's noted within the endometrial canal. Hcg: 329  Assessment and Plan   A:  1. Abortion with complication   2. Vaginal bleeding during pregnancy   3. Retained products of conception, early pregnancy   4. Type O blood, Rh positive     P:  Discharge home in stable condition Rx: cyototec, ibuprofen, percocet. F/u in the office in 1 week for non-stat quant. Message sent to the office Return to MAU if symptoms worsen  Juleah Paradise, Harolyn Rutherford, NP 09/01/2020 7:52 PM

## 2020-09-02 ENCOUNTER — Ambulatory Visit: Payer: Self-pay

## 2020-09-13 ENCOUNTER — Ambulatory Visit: Payer: Self-pay

## 2020-09-17 ENCOUNTER — Encounter: Payer: Self-pay | Admitting: Family Medicine

## 2020-09-27 ENCOUNTER — Other Ambulatory Visit: Payer: Self-pay

## 2020-09-27 ENCOUNTER — Other Ambulatory Visit: Payer: Self-pay | Admitting: *Deleted

## 2020-09-27 DIAGNOSIS — O0380 Unspecified complication following complete or unspecified spontaneous abortion: Secondary | ICD-10-CM

## 2020-09-27 DIAGNOSIS — O021 Missed abortion: Secondary | ICD-10-CM

## 2020-09-28 ENCOUNTER — Encounter: Payer: Self-pay | Admitting: Family Medicine

## 2020-09-28 LAB — BETA HCG QUANT (REF LAB): hCG Quant: 4 m[IU]/mL

## 2020-12-04 ENCOUNTER — Emergency Department (HOSPITAL_COMMUNITY): Payer: BC Managed Care – PPO

## 2020-12-04 ENCOUNTER — Emergency Department (HOSPITAL_COMMUNITY)
Admission: EM | Admit: 2020-12-04 | Discharge: 2020-12-04 | Disposition: A | Discharge status: Home / Self Care | Payer: BC Managed Care – PPO | Attending: Emergency Medicine | Admitting: Emergency Medicine

## 2020-12-04 ENCOUNTER — Other Ambulatory Visit: Payer: Self-pay

## 2020-12-04 DIAGNOSIS — O200 Threatened abortion: Secondary | ICD-10-CM

## 2020-12-04 DIAGNOSIS — Z3A01 Less than 8 weeks gestation of pregnancy: Secondary | ICD-10-CM | POA: Diagnosis not present

## 2020-12-04 DIAGNOSIS — O26891 Other specified pregnancy related conditions, first trimester: Secondary | ICD-10-CM | POA: Insufficient documentation

## 2020-12-04 DIAGNOSIS — R102 Pelvic and perineal pain: Secondary | ICD-10-CM | POA: Diagnosis not present

## 2020-12-04 DIAGNOSIS — O219 Vomiting of pregnancy, unspecified: Secondary | ICD-10-CM | POA: Insufficient documentation

## 2020-12-04 DIAGNOSIS — R112 Nausea with vomiting, unspecified: Secondary | ICD-10-CM

## 2020-12-04 DIAGNOSIS — E860 Dehydration: Secondary | ICD-10-CM

## 2020-12-04 DIAGNOSIS — R110 Nausea: Secondary | ICD-10-CM | POA: Diagnosis not present

## 2020-12-04 LAB — CBC
HCT: 32.3 % — ABNORMAL LOW (ref 36.0–46.0)
Hemoglobin: 9.1 g/dL — ABNORMAL LOW (ref 12.0–15.0)
MCH: 19.7 pg — ABNORMAL LOW (ref 26.0–34.0)
MCHC: 28.2 g/dL — ABNORMAL LOW (ref 30.0–36.0)
MCV: 70.1 fL — ABNORMAL LOW (ref 80.0–100.0)
Platelets: 237 10*3/uL (ref 150–400)
RBC: 4.61 MIL/uL (ref 3.87–5.11)
RDW: 29.5 % — ABNORMAL HIGH (ref 11.5–15.5)
WBC: 9.5 10*3/uL (ref 4.0–10.5)
nRBC: 0 % (ref 0.0–0.2)

## 2020-12-04 LAB — URINALYSIS, ROUTINE W REFLEX MICROSCOPIC
Bilirubin Urine: NEGATIVE
Bilirubin Urine: NEGATIVE
Glucose, UA: NEGATIVE mg/dL
Glucose, UA: NEGATIVE mg/dL
Hgb urine dipstick: NEGATIVE
Ketones, ur: NEGATIVE mg/dL
Ketones, ur: NEGATIVE mg/dL
Nitrite: NEGATIVE
Nitrite: NEGATIVE
Protein, ur: NEGATIVE mg/dL
Protein, ur: NEGATIVE mg/dL
Specific Gravity, Urine: 1.023 (ref 1.005–1.030)
Specific Gravity, Urine: 1.008 (ref 1.005–1.030)
pH: 6 (ref 5.0–8.0)
pH: 5 (ref 5.0–8.0)

## 2020-12-04 LAB — COMPREHENSIVE METABOLIC PANEL
ALT: 17 U/L (ref 0–44)
AST: 17 U/L (ref 15–41)
Albumin: 3.6 g/dL (ref 3.5–5.0)
Alkaline Phosphatase: 62 U/L (ref 38–126)
Anion gap: 7 (ref 5–15)
BUN: 9 mg/dL (ref 6–20)
CO2: 24 mmol/L (ref 22–32)
Calcium: 8.6 mg/dL — ABNORMAL LOW (ref 8.9–10.3)
Chloride: 103 mmol/L (ref 98–111)
Creatinine, Ser: 0.62 mg/dL (ref 0.44–1.00)
GFR, Estimated: >60 mL/min (ref >60)
Glucose, Bld: 91 mg/dL (ref 70–99)
Potassium: 3.5 mmol/L (ref 3.5–5.1)
Sodium: 134 mmol/L — ABNORMAL LOW (ref 135–145)
Total Bilirubin: 0.3 mg/dL (ref 0.3–1.2)
Total Protein: 6.7 g/dL (ref 6.5–8.1)

## 2020-12-04 LAB — TYPE AND SCREEN
ABO/RH(D): O POS
Antibody Screen: NEGATIVE

## 2020-12-04 LAB — I-STAT BETA HCG BLOOD, ED (MC, WL, AP ONLY): I-stat hCG, quantitative: >2000 m[IU]/mL — ABNORMAL HIGH (ref <5)

## 2020-12-04 LAB — ABO/RH: ABO/RH(D): O POS

## 2020-12-04 LAB — LIPASE, BLOOD: Lipase: 36 U/L (ref 11–51)

## 2020-12-04 MED ORDER — ONDANSETRON 4 MG PO TBDP: 4.0000 mg | ORAL_TABLET | Freq: Three times a day (TID) | ORAL | 0 refills | Status: DC | PRN

## 2020-12-04 MED ORDER — SODIUM CHLORIDE 0.9 % IV BOLUS
1000.0000 mL | Freq: Once | INTRAVENOUS | Status: AC
  Administered 2020-12-04: 06:00:00 1000 mL via INTRAVENOUS

## 2020-12-04 MED ORDER — HYOSCYAMINE SULFATE 0.125 MG SL SUBL
0.2500 mg | SUBLINGUAL_TABLET | Freq: Once | SUBLINGUAL | Status: AC
  Administered 2020-12-04: 06:00:00 0.25 mg via SUBLINGUAL
  Filled 2020-12-04: qty 2

## 2020-12-04 MED ORDER — NITROFURANTOIN MONOHYD MACRO 100 MG PO CAPS: 100.0000 mg | ORAL_CAPSULE | Freq: Two times a day (BID) | ORAL | 0 refills | Status: DC

## 2020-12-04 MED ORDER — ALUM & MAG HYDROXIDE-SIMETH 200-200-20 MG/5ML PO SUSP
30.0000 mL | Freq: Once | ORAL | Status: AC
  Administered 2020-12-04: 06:00:00 30 mL via ORAL
  Filled 2020-12-04: qty 30

## 2020-12-04 MED ORDER — NITROFURANTOIN MONOHYD MACRO 100 MG PO CAPS
100.0000 mg | ORAL_CAPSULE | Freq: Once | ORAL | Status: DC
  Filled 2020-12-04: qty 1

## 2020-12-04 NOTE — ED Triage Notes (Signed)
Pt c/o dull lower abd pain since last night. Unable to keep food/water down. Fever of unknown temp (did not have thermometer) controlled w tylenol. LMP 5/20, no urinary symptoms.

## 2020-12-04 NOTE — ED Provider Notes (Signed)
Pt signed out from Dr. Eudelia Bunch.    [place]:  IMPRESSION: Single living intrauterine gestation with an estimated gestational age of [redacted] weeks, 5 days.   Moderate subchorionic hemorrhage.  Pt is now tolerating fluids well.  No abdominal pain.  She is reporting some vaginal bleeding after the [place], but blood type is O+.  Repeat urine still showing large LE and some wbcs.  I will treat her with macrobid.  She is to return if worse.  F/u with obgyn.       Jacalyn Lefevre, MD 12/04/20 [phone]

## 2020-12-04 NOTE — ED Notes (Signed)
Pt was gingerale and has tolerated well so far.

## 2020-12-04 NOTE — ED Provider Notes (Signed)
[facility] EMERGENCY DEPARTMENT Provider Note  CSN: [id number] Arrival date & time: 12/04/20 6295  Chief Complaint(s) Abdominal Pain  HPI Maalle Desired [name] is a 30 y.o. female here for 2 days of lower abdominal discomfort with nausea and nonbloody nonbilious emesis and inability to tolerate oral intake.  She denies any suspicious food intake.  She reports subjective fevers at home.  No known sick contacts.  No cough or congestion.  No urinary symptoms.  No diarrhea.  Last menstrual cycle was around May 20.  However she is unsure of the exact date stating that since she had a miscarriage earlier in the year her cycles have been regular. No vaginal discharge or bleeding.  HPI  Past Medical History Past Medical History:  Diagnosis Date   Chlamydia    Complete abortion 07/27/2011   Patient Active Problem List   Diagnosis Date Noted   Term newborn delivered vaginally, current hospitalization 02/03/2019   Normal labor 02/02/2019   Complete abortion 07/27/2011   Chlamydia 07/27/2011   Home Medication(s) Prior to Admission medications   Medication Sig Start Date End Date Taking? Authorizing Provider  acetaminophen (TYLENOL) 500 MG tablet Take 500-1,000 mg by mouth every 6 (six) hours as needed for headache or moderate pain.   Yes [provider]  acetaminophen (TYLENOL) 325 MG tablet Take 2 tablets (650 mg total) by mouth every 6 (six) hours as needed (for pain scale < 4). Patient not taking: No sig reported 02/03/19   Joselyn Arrow, MD  ibuprofen (ADVIL) 600 MG tablet Take 1 tablet (600 mg total) by mouth every 8 (eight) hours as needed. Patient not taking: No sig reported 08/26/20   Rasch, Victorino Dike I, NP  misoprostol (CYTOTEC) 200 MCG tablet Take 4 tablets (800 mcg total) by mouth once for 1 dose. Place tablets in cheek and let dissolve Patient not taking: Reported on 12/04/2020 08/26/20 08/26/20  Rasch, Victorino Dike I, NP  ondansetron (ZOFRAN ODT) 4  MG disintegrating tablet 4mg  ODT q4 hours prn nausea/vomit Patient not taking: No sig reported 04/20/20   13/9/21, MD  oxyCODONE-acetaminophen (PERCOCET) 5-325 MG tablet Take 1-2 tablets by mouth every 4 (four) hours as needed for severe pain. Patient not taking: No sig reported 08/26/20 08/26/21  Rasch, 08/28/21, NP                                                                                                                                    Past Surgical History Past Surgical History:  Procedure Laterality Date   left elbow surgery     Family History Family History  Problem Relation Age of Onset   Healthy Father    Healthy Mother    Anesthesia problems Neg Hx     Social History Social History   Tobacco Use   Smoking status: Never   Smokeless tobacco: Never  Vaping Use   Vaping Use: Some  days  Substance Use Topics   Alcohol use: No    Comment: prior to pregnancy   Drug use: No   Allergies Patient has no known allergies.  Review of Systems Review of Systems All other systems are reviewed and are negative for acute change except as noted in the HPI  Physical Exam Vital Signs  I have reviewed the triage vital signs BP 120/76   Pulse 75   Temp 98.7 F (37.1 C) (Oral)   Resp 13   LMP 06/06/2020   SpO2 100%   Physical Exam Vitals reviewed.  Constitutional:      General: She is not in acute distress.    Appearance: She is well-developed. She is not diaphoretic.  HENT:     Head: Normocephalic and atraumatic.     Right Ear: External ear normal.     Left Ear: External ear normal.     Nose: Nose normal.  Eyes:     General: No scleral icterus.    Conjunctiva/sclera: Conjunctivae normal.  Neck:     Trachea: Phonation normal.  Cardiovascular:     Rate and Rhythm: Normal rate and regular rhythm.  Pulmonary:     Effort: Pulmonary effort is normal. No respiratory distress.     Breath sounds: No stridor.  Abdominal:     General: There is no  distension.     Tenderness: There is abdominal tenderness (mild) in the right lower quadrant and left lower quadrant. There is no guarding or rebound.  Musculoskeletal:        General: Normal range of motion.     Cervical back: Normal range of motion.  Neurological:     Mental Status: She is alert and oriented to person, place, and time.  Psychiatric:        Behavior: Behavior normal.    ED Results and Treatments Labs (all labs ordered are listed, but only abnormal results are displayed) Labs Reviewed  COMPREHENSIVE METABOLIC PANEL - Abnormal; Notable for the following components:      Result Value   Sodium 134 (*)    Calcium 8.6 (*)    All other components within normal limits  CBC - Abnormal; Notable for the following components:   Hemoglobin 9.1 (*)    HCT 32.3 (*)    MCV 70.1 (*)    MCH 19.7 (*)    MCHC 28.2 (*)    RDW 29.5 (*)    All other components within normal limits  URINALYSIS, ROUTINE W REFLEX MICROSCOPIC - Abnormal; Notable for the following components:   APPearance CLOUDY (*)    Leukocytes,Ua LARGE (*)    Bacteria, UA RARE (*)    All other components within normal limits  I-STAT BETA HCG BLOOD, ED ([facility], [facility], [facility] ONLY) - Abnormal; Notable for the following components:   I-stat hCG, quantitative >2,000.0 (*)    All other components within normal limits  LIPASE, BLOOD  URINALYSIS, ROUTINE W REFLEX MICROSCOPIC  ABO/RH  EKG  EKG Interpretation  Date/Time:    Ventricular Rate:    PR Interval:    QRS Duration:   QT Interval:    QTC Calculation:   R Axis:     Text Interpretation:          Radiology No results found.  Pertinent labs & imaging results that were available during my care of the patient were reviewed by me and considered in my medical decision making (see chart for details).  Medications Ordered in ED Medications  sodium  chloride 0.9 % bolus 1,000 mL (1,000 mLs Intravenous New Bag/Given 12/04/20 0552)  hyoscyamine (LEVSIN SL) SL tablet 0.25 mg (0.25 mg Sublingual Given 12/04/20 0544)  alum & mag hydroxide-simeth (MAALOX/MYLANTA) 200-200-20 MG/5ML suspension 30 mL (30 mLs Oral Given 12/04/20 0544)                                                                                                                                    Procedures Procedures  (including critical care time)  Medical Decision Making / ED Course I have reviewed the nursing notes for this encounter and the patient's prior records (if available in EHR or on provided paperwork).   Florene Desired [name] was evaluated in Emergency Department on 12/04/2020 for the symptoms described in the history of present illness. She was evaluated in the context of the global COVID-19 pandemic, which necessitated consideration that the patient might be at risk for infection with the SARS-CoV-2 virus that causes COVID-19. Institutional protocols and algorithms that pertain to the evaluation of patients at risk for COVID-19 are in a state of rapid change based on information released by regulatory bodies including the CDC and federal and state organizations. These policies and algorithms were followed during the patient's care in the ED.  Lower abdominal pain with nausea and vomiting. Abdomen with mild discomfort to palpation in the lower regions.  No signs of peritonitis.. Labs without leukocytosis. No significant electrolyte derangements or renal sufficiency.. No evidence of biliary obstruction or pancreatitis. UA contaminated, will repeat Initial beta-hCG on resulted yet.  hCG was redrawn and noted to be positive. Will obtain ultrasound to rule out ectopic pregnancy.   Patient care turned over to Dr Particia Nearing. Patient case and results discussed in detail; please see their note for further ED managment.       Final Clinical Impression(s) / ED  Diagnoses Final diagnoses:  Pelvic pain      This chart was dictated using voice recognition software.  Despite best efforts to proofread,  errors can occur which can change the documentation meaning.    Nira Conn, MD 12/04/20 [phone]

## 2020-12-04 NOTE — ED Notes (Signed)
Patient transported to Ultrasound 

## 2020-12-04 NOTE — Discharge Instructions (Addendum)
EDD 08/01/21

## 2020-12-08 ENCOUNTER — Telehealth (INDEPENDENT_AMBULATORY_CARE_PROVIDER_SITE_OTHER): Payer: Self-pay

## 2020-12-08 DIAGNOSIS — Z3480 Encounter for supervision of other normal pregnancy, unspecified trimester: Secondary | ICD-10-CM | POA: Insufficient documentation

## 2020-12-08 DIAGNOSIS — Z3A00 Weeks of gestation of pregnancy not specified: Secondary | ICD-10-CM

## 2020-12-08 NOTE — Progress Notes (Signed)
New OB Intake  I connected with  Brandi Austin on 12/08/20 at  3:15 PM EDT by MyChart Video Visit and verified that I am speaking with the correct person using two identifiers. Nurse is located at [facility] and pt is located at home.  I discussed the limitations, risks, security and privacy concerns of performing an evaluation and management service by telephone and the availability of in person appointments. I also discussed with the patient that there may be a patient responsible charge related to this service. The patient expressed understanding and agreed to proceed.  I explained I am completing New OB Intake today. We discussed her EDD of 08/01/20 that is based on U/S on 12/04/20. Pt is G6/P3. I reviewed her allergies, medications, Medical/Surgical/OB history, and appropriate screenings. I informed her of [facility] services. Based on history, this is a/an  pregnancy uncomplicated .   Patient Active Problem List   Diagnosis Date Noted   Complete abortion 07/27/2011    Concerns addressed today  Delivery Plans:  Plans to deliver at [facility].   MyChart/Babyscripts MyChart access verified. I explained pt will have some visits in office and some virtually. Babyscripts instructions given and order placed. Patient verifies receipt of registration text/e-mail. Account successfully created and app downloaded.  Blood Pressure Cuff  Patient is self-pay; explained patient will be given BP cuff at first prenatal appt. Explained after first prenatal appt pt will check weekly and document in Babyscripts.  Weight scale: Patient    have weight scale. Weight scale ordered   Anatomy US Explained first scheduled Korea will be around 19 weeks. Anatomy US scheduled for 03/07/21 at 09:00a. Pt notified to arrive at 08:45a.  Labs Discussed Avelina Laine genetic screening with patient. Would like both Panorama and Horizon drawn at new OB visit. Routine prenatal labs needed.  Covid Vaccine Patient has not  covid vaccine.   Mother/ Baby Dyad Candidate?    If yes, offer as possibility  Inform patient of [facility] and place . In AVS   Social Determinants of Health Food Insecurity: Patient denies food insecurity. WIC Referral: Patient is interested in referral to [facility].  Transportation: Patient denies transportation needs. Childcare: Discussed no children allowed at ultrasound appointments. Offered childcare services; patient declines childcare services at this time.  First visit review I reviewed new OB appt with pt. I explained she will have a pelvic exam, ob bloodwork with genetic screening, and PAP smear. Explained pt will be seen by Monna Fam at first visit; encounter routed to appropriate provider. Explained that patient will be seen by pregnancy navigator following visit with provider. [facility] information placed in AVS.   Henrietta Dine, CMA 12/08/2020  3:42 PM

## 2020-12-15 ENCOUNTER — Encounter: Payer: Self-pay | Admitting: Family Medicine

## 2020-12-15 ENCOUNTER — Encounter: Payer: Self-pay | Admitting: Certified Nurse Midwife

## 2021-01-05 ENCOUNTER — Other Ambulatory Visit: Payer: Self-pay

## 2021-01-05 ENCOUNTER — Encounter: Payer: Self-pay | Admitting: Certified Nurse Midwife

## 2021-01-05 ENCOUNTER — Encounter: Payer: Self-pay | Admitting: Family Medicine

## 2021-01-05 ENCOUNTER — Ambulatory Visit (INDEPENDENT_AMBULATORY_CARE_PROVIDER_SITE_OTHER): Payer: Self-pay | Admitting: Certified Nurse Midwife

## 2021-01-05 VITALS — BP 116/59 | HR 73 | Wt 171.4 lb

## 2021-01-05 DIAGNOSIS — R519 Headache, unspecified: Secondary | ICD-10-CM

## 2021-01-05 DIAGNOSIS — Z3A10 10 weeks gestation of pregnancy: Secondary | ICD-10-CM

## 2021-01-05 DIAGNOSIS — O26891 Other specified pregnancy related conditions, first trimester: Secondary | ICD-10-CM

## 2021-01-05 DIAGNOSIS — Z3491 Encounter for supervision of normal pregnancy, unspecified, first trimester: Secondary | ICD-10-CM

## 2021-01-05 DIAGNOSIS — O99019 Anemia complicating pregnancy, unspecified trimester: Secondary | ICD-10-CM

## 2021-01-05 DIAGNOSIS — R110 Nausea: Secondary | ICD-10-CM

## 2021-01-05 DIAGNOSIS — D509 Iron deficiency anemia, unspecified: Secondary | ICD-10-CM

## 2021-01-05 NOTE — Progress Notes (Signed)
  Nursing Staff Provider  Office Location  [facility] Dating    Language  English Anatomy US    Flu Vaccine  No Genetic/Carrier Screen  NIPS:    AFP:    Horizon:  TDaP Vaccine    Hgb A1C or  GTT Early  Third trimester   COVID Vaccine Declined   LAB RESULTS   Rhogam   Blood Type --/--/O POS, O POS (06/25 0610)   Baby Feeding Plan Breast/Bottle Antibody NEG (06/25 0610)  Contraception Undecided Rubella    Circumcision Yes RPR     Pediatrician  [facility]     Support Person FOB HCVAb   Prenatal Classes  HIV       BTL Consent  GBS   (For PCN allergy, check sensitivities)   VBAC Consent  Pap        DME Rx [ X] BP cuff [ ]  Weight Scale Waterbirth  [ ]  Class [ ]  Consent [ ]  CNM visit  PHQ9 & GAD7 [  X] new OB [  ] 28 weeks  [  ] 36 weeks Induction  [ ]  Orders Entered [ ] Foley Y/N

## 2021-01-05 NOTE — Progress Notes (Signed)
History:   Brandi Austin is a 30 y.o. [phone] at [redacted]w[redacted]d by early ultrasound @ [redacted]w[redacted]d, being seen today for her first obstetrical visit.  She has an uncomplicated obstetrical history. Patient does intend to breast feed. Pregnancy history fully reviewed.  Patient reports nausea and intermittent low abdominal cramping. She states that she also is experiencing frequent headaches . She states that she has not tried anything to treat the nausea or headaches. She also has complaints for fatigue.  Pt states that she does not remember when her last pap was or the results.       HISTORY: OB History  Gravida Para Term Preterm AB Living  6 3 3  0 1 3  SAB IAB Ectopic Multiple Live Births  1 0 0 0 3    # Outcome Date GA Lbr Len/2nd Weight Sex Delivery Anes PTL Lv  6 Current           5 Term 02/02/19 [redacted]w[redacted]d 07:52 / 00:16 3306 g F Vag-Spont EPI  LIV     Birth Comments: none     Name: Brandi Austin     Apgar1: 8  Apgar5: 9  4 Term 05/20/13    F Vag-Spont  N LIV  3 Term 05/05/12 [redacted]w[redacted]d 11:59 / 00:25 3062 g F Vag-Spont EPI  LIV     Name: [redacted]w[redacted]d     Apgar1: 9  Apgar5: 9  2 SAB           1 Gravida             Last pap smear was done 2020 and was abnormal - ASC-US with +HPV, per [facility] records, pt was offered colpo but declined due to cost.  Past Medical History:  Diagnosis Date   Chlamydia    Complete abortion 07/27/2011   Medical history non-contributory    Past Surgical History:  Procedure Laterality Date   left elbow surgery     Family History  Problem Relation Age of Onset   Healthy Father    Healthy Mother    Anesthesia problems Neg Hx    Social History   Tobacco Use   Smoking status: Never   Smokeless tobacco: Never  Vaping Use   Vaping Use: Some days  Substance Use Topics   Alcohol use: No    Comment: prior to pregnancy   Drug use: No   No Known Allergies Current Outpatient Medications on File Prior to Visit  Medication  Sig Dispense Refill   acetaminophen (TYLENOL) 325 MG tablet Take 2 tablets (650 mg total) by mouth every 6 (six) hours as needed (for pain scale < 4). 30 tablet 0   acetaminophen (TYLENOL) 500 MG tablet Take 500-1,000 mg by mouth every 6 (six) hours as needed for headache or moderate pain.     Prenatal Vit-Fe Fumarate-FA (PRENATAL MULTIVITAMIN) TABS tablet Take 1 tablet by mouth daily at 12 noon.     ibuprofen (ADVIL) 600 MG tablet Take 1 tablet (600 mg total) by mouth every 8 (eight) hours as needed. (Patient not taking: No sig reported) 30 tablet 0   misoprostol (CYTOTEC) 200 MCG tablet Take 4 tablets (800 mcg total) by mouth once for 1 dose. Place tablets in cheek and let dissolve (Patient not taking: Reported on 12/04/2020) 4 tablet 0   nitrofurantoin, macrocrystal-monohydrate, (MACROBID) 100 MG capsule Take 1 capsule (100 mg total) by mouth 2 (two) times daily. (Patient not taking: Reported on 01/05/2021) 10 capsule 0   ondansetron (  ZOFRAN ODT) 4 MG disintegrating tablet Take 1 tablet (4 mg total) by mouth every 8 (eight) hours as needed for nausea or vomiting. (Patient not taking: No sig reported) 10 tablet 0   oxyCODONE-acetaminophen (PERCOCET) 5-325 MG tablet Take 1-2 tablets by mouth every 4 (four) hours as needed for severe pain. (Patient not taking: No sig reported) 6 tablet 0   No current facility-administered medications on file prior to visit.    Review of Systems Pertinent items noted in HPI and remainder of comprehensive ROS otherwise negative. Physical Exam:   Vitals:   01/05/21 0911  BP: (!) 116/59  Pulse: 73  Weight: 77.7 kg     Completed Bedside Ultrasound for FHR check: Viable intrauterine pregnancy with active fetal movement present on [place]. Follow up with nurse in 2 weeks for fetal heart rate confirmation.  Patient informed that the ultrasound is considered a limited obstetric ultrasound and is not intended to be a complete ultrasound exam.  Patient also informed that the  ultrasound is not being completed with the intent of assessing for fetal or placental anomalies or any pelvic abnormalities.  Explained that the purpose of today's ultrasound is to assess for fetal heart rate. Patient acknowledges the purpose of the exam and the limitations of the study.  Constitutional: Well-developed, well-nourished pregnant female in no acute distress.  HEENT: PERRLA Skin: normal color and turgor, no rash Cardiovascular: normal rate & rhythm Respiratory: normal effort GI: Abd soft, non-tender, pos BS x 4, gravid appropriate for gestational age MS: Extremities nontender, no edema, normal ROM Neurologic: Alert and oriented x 4.  GU: no CVA tenderness Pelvic: deferred   Assessment & Plan:    1. Supervision of low-risk pregnancy, first trimester - Genetic Screening - CBC/D/Plt+RPR+Rh+ABO+RubIgG... - Culture, OB Urine - Hemoglobin A1c  Initial labs drawn. Continue prenatal vitamins. Problem list reviewed and updated. Genetic Screening discussed, First trimester screen, Quad screen, and NIPS: requested. Ultrasound discussed; fetal anatomic survey: requested. Anticipatory guidance about prenatal visits given including labs, ultrasounds, and testing. Discussed usage of Babyscripts and virtual visits as additional source of managing and completing prenatal visits in midst of coronavirus and pandemic.   Encouraged to complete MyChart Registration for her ability to review results, send requests, and have questions addressed.  The nature of [place] [facility] Healthcare/Faculty Practice with multiple MDs and Advanced Practice Providers was explained to patient; also emphasized that residents, students are part of our team. Routine obstetric precautions reviewed. Encouraged to seek out care at office or emergency room [facility] preferred) for urgent and/or emergent concerns.  2. [redacted] weeks gestation of pregnancy - Brandi Austin doing well. Partner "Brandi Austin"  present for visit and supportive.  - Introduction to practice provided.   3. Nausea and Vomiting in pregnancy.  - Recommended OTC Unisom at bedtime and B6 Vitamin daily for nausea. Prescription instructions given at bedside and in D/C summary.   4. Headache in pregnancy, antepartum, first trimester - Recommended nightly PO Magnesium for headaches.     Return in about 2 weeks (around 01/19/2021) for RN visit for FHT, 4wks for provider visit, IN-PERSON, LOB.     Brandi Austin) Suzie Portela, BSN, RNC-OB  Student Nurse-Midwife   01/05/2021  12:44 PM

## 2021-01-05 NOTE — Patient Instructions (Signed)
Take one doxylamine succinate at bedtime and 50mg  up to 3 times daily.

## 2021-01-06 LAB — CBC/D/PLT+RPR+RH+ABO+RUBIGG...
Antibody Screen: NEGATIVE
Basophils Absolute: 0 10*3/uL (ref 0.0–0.2)
Basos: 0 %
EOS (ABSOLUTE): 0.3 10*3/uL (ref 0.0–0.4)
Eos: 4 %
HCV Ab: 0.1 s/co ratio (ref 0.0–0.9)
HIV Screen 4th Generation wRfx: NONREACTIVE
Hematocrit: 34.4 % (ref 34.0–46.6)
Hemoglobin: 10.1 g/dL — ABNORMAL LOW (ref 11.1–15.9)
Hepatitis B Surface Ag: NEGATIVE
Immature Grans (Abs): 0 10*3/uL (ref 0.0–0.1)
Immature Granulocytes: 1 %
Lymphocytes Absolute: 1.4 10*3/uL (ref 0.7–3.1)
Lymphs: 19 %
MCH: 21 pg — ABNORMAL LOW (ref 26.6–33.0)
MCHC: 29.4 g/dL — ABNORMAL LOW (ref 31.5–35.7)
MCV: 72 fL — ABNORMAL LOW (ref 79–97)
Monocytes Absolute: 0.4 10*3/uL (ref 0.1–0.9)
Monocytes: 6 %
Neutrophils Absolute: 5.3 10*3/uL (ref 1.4–7.0)
Neutrophils: 70 %
Platelets: 241 10*3/uL (ref 150–450)
RBC: 4.81 x10E6/uL (ref 3.77–5.28)
RDW: 26 % — ABNORMAL HIGH (ref 11.7–15.4)
RPR Ser Ql: NONREACTIVE
Rh Factor: POSITIVE
Rubella Antibodies, IGG: 10.4 index (ref >0.99)
WBC: 7.5 10*3/uL (ref 3.4–10.8)

## 2021-01-06 LAB — HEMOGLOBIN A1C
Est. average glucose Bld gHb Est-mCnc: 97 mg/dL
Hgb A1c MFr Bld: 5 % (ref 4.8–5.6)

## 2021-01-06 LAB — HCV INTERPRETATION

## 2021-01-06 MED ORDER — POLYSACCHARIDE IRON COMPLEX 150 MG PO CAPS: 150.0000 mg | ORAL_CAPSULE | Freq: Every day | ORAL | 3 refills | Status: DC

## 2021-01-06 NOTE — Addendum Note (Signed)
Addended by: Edd Arbour on: 01/06/2021 10:49 AM   Modules accepted: Orders

## 2021-01-07 LAB — URINE CULTURE, OB REFLEX

## 2021-01-07 LAB — CULTURE, OB URINE

## 2021-01-11 ENCOUNTER — Telehealth: Payer: Self-pay | Admitting: General Practice

## 2021-01-11 MED ORDER — ONDANSETRON 4 MG PO TBDP: 4.0000 mg | ORAL_TABLET | Freq: Three times a day (TID) | ORAL | 0 refills | Status: DC | PRN

## 2021-01-11 NOTE — Telephone Encounter (Signed)
Patient called and left message on nurse voicemail line stating she has questions about a nausea medication she is on. Patient is requesting a callback.   Called patient stating I am returning her phone call. Patient states last week she was feeling really nauseous and had to take unisom during the day. As a result she missed work that day and wants to know if we can provide her a note. Told patient unfortunately not because we did not see or evaluate her that day or remove her from work. Offered Rx for nausea medication that will not cause such fatigue and will allow her to work. Patient verbalized understanding to all. Rx for zofran sent to pharmacy per Dr Crissie Reese.

## 2021-01-19 ENCOUNTER — Other Ambulatory Visit: Payer: Self-pay

## 2021-01-19 ENCOUNTER — Ambulatory Visit (INDEPENDENT_AMBULATORY_CARE_PROVIDER_SITE_OTHER): Payer: Self-pay

## 2021-01-19 ENCOUNTER — Encounter: Payer: Self-pay | Admitting: Family Medicine

## 2021-01-19 VITALS — BP 110/65 | HR 75 | Ht 58.5 in | Wt 172.4 lb

## 2021-01-19 DIAGNOSIS — Z3480 Encounter for supervision of other normal pregnancy, unspecified trimester: Secondary | ICD-10-CM

## 2021-01-19 NOTE — Progress Notes (Signed)
Patient was assessed and managed by nursing staff during this encounter. I have reviewed the chart and agree with the documentation and plan.   Edd Arbour, MSN, CNM, [facility] 01/19/21 11:51 AM

## 2021-01-19 NOTE — Progress Notes (Signed)
Pt here today for FHT check her Harvin Hazel, CNM at initial OB visit on 01/05/21. Denies any vaginal bleeding, cramps or pain.  FHT unable to find with doppler. Lyla Son, RN used bedside US. Bedside US identified FHT with vigorous movement. Fetal well being noted. FHT 155.  Pt has next ROB appt on 01/25/21.  Judeth Cornfield, RN

## 2021-01-25 ENCOUNTER — Encounter: Payer: Self-pay | Admitting: Family Medicine

## 2021-01-26 ENCOUNTER — Encounter: Payer: Self-pay | Admitting: General Practice

## 2021-02-03 ENCOUNTER — Encounter: Payer: Self-pay | Admitting: Obstetrics and Gynecology

## 2021-02-03 ENCOUNTER — Other Ambulatory Visit: Payer: Self-pay

## 2021-02-03 ENCOUNTER — Ambulatory Visit (INDEPENDENT_AMBULATORY_CARE_PROVIDER_SITE_OTHER): Payer: Self-pay | Admitting: Obstetrics and Gynecology

## 2021-02-03 VITALS — BP 130/62 | HR 81 | Wt 175.0 lb

## 2021-02-03 DIAGNOSIS — Z23 Encounter for immunization: Secondary | ICD-10-CM

## 2021-02-03 DIAGNOSIS — Z3A14 14 weeks gestation of pregnancy: Secondary | ICD-10-CM

## 2021-02-03 DIAGNOSIS — O99019 Anemia complicating pregnancy, unspecified trimester: Secondary | ICD-10-CM

## 2021-02-03 DIAGNOSIS — R87610 Atypical squamous cells of undetermined significance on cytologic smear of cervix (ASC-US): Secondary | ICD-10-CM

## 2021-02-03 DIAGNOSIS — Z3480 Encounter for supervision of other normal pregnancy, unspecified trimester: Secondary | ICD-10-CM

## 2021-02-03 DIAGNOSIS — R87810 Cervical high risk human papillomavirus (HPV) DNA test positive: Secondary | ICD-10-CM

## 2021-02-03 NOTE — Progress Notes (Signed)
   PRENATAL VISIT NOTE  Subjective:  Brandi Austin is a 30 y.o. [phone] at [redacted]w[redacted]d being seen today for ongoing prenatal care.  She is currently monitored for the following issues for this low-risk pregnancy and has Supervision of other normal pregnancy, antepartum and Antepartum anemia on their problem list.  Patient reports no complaints.  Contractions: Not present. Vag. Bleeding: None.  Movement: Absent. Denies leaking of fluid.   The following portions of the patient's history were reviewed and updated as appropriate: allergies, current medications, past family history, past medical history, past social history, past surgical history and problem list.   Objective:   Vitals:   02/03/21 1627  BP: 130/62  Pulse: 81  Weight: 175 lb (79.4 kg)    Fetal Status: Fetal Heart Rate (bpm): 140   Movement: Absent     General:  Alert, oriented and cooperative. Patient is in no acute distress.  Skin: Skin is warm and dry. No rash noted.   Cardiovascular: Normal heart rate noted  Respiratory: Normal respiratory effort, no problems with respiration noted  Abdomen: Soft, gravid, appropriate for gestational age.  Pain/Pressure: Absent     Pelvic: Cervical exam deferred        Extremities: Normal range of motion.  Edema: Trace  Mental Status: Normal mood and affect. Normal behavior. Normal judgment and thought content.   Assessment and Plan:  Pregnancy: [id number] at [redacted]w[redacted]d 1. Supervision of other normal pregnancy, antepartum Routine care. Anatomy u/s next month already scheduled. Offer afp then. Nausea with pregnancy stable on PRN zofran. Pt holding off on Mg for any headaches - Flu Vaccine QUAD 66mo+IM (Fluarix, Fluzone & Alfiuria Quad PF)  2. Antepartum anemia D/w her re: iron  3. History of abnormal pap smear Pt still hasn't applied for insurance. If hasn't by nv, I d/w her either doing pap or just referring to [facility]  Preterm labor symptoms and general obstetric precautions  including but not limited to vaginal bleeding, contractions, leaking of fluid and fetal movement were reviewed in detail with the patient. Please refer to After Visit Summary for other counseling recommendations.   Return in about 1 month (around 03/07/2021) for in person, low risk ob, md or app.  Future Appointments  Date Time Provider Department Center  03/03/2021  3:35 PM Bancroft Bing, MD [facility]  03/07/2021  9:00 AM [facility]    Carey Bing, MD

## 2021-02-24 ENCOUNTER — Telehealth: Payer: Self-pay

## 2021-02-24 NOTE — Telephone Encounter (Signed)
Call placed back to pt. Pt states having sore throat, chills, possible fever, does not have thermometer to check. Pt also having congestion and feeling run down. Pt advised to have Covid testing done and if positive, then notify us so we can possibly change her appt next week. Pt advised can take Tylenol, Claritin and increase fluids to keep hydrated. Only eat light meals if feels up to it. Pt agreeable to plan of care.   Judeth Cornfield, RN

## 2021-02-24 NOTE — Telephone Encounter (Signed)
VM left on nurse line requesting a call back. Reports flu like symptoms. Would like to know what she can take other than Tylenol. Also would like to talk about what to eat and drink.

## 2021-03-03 ENCOUNTER — Encounter: Payer: Self-pay | Admitting: Obstetrics and Gynecology

## 2021-03-03 ENCOUNTER — Other Ambulatory Visit: Payer: Self-pay

## 2021-03-03 ENCOUNTER — Ambulatory Visit (INDEPENDENT_AMBULATORY_CARE_PROVIDER_SITE_OTHER): Payer: Self-pay | Admitting: Obstetrics and Gynecology

## 2021-03-03 VITALS — BP 127/71 | HR 82 | Wt 176.7 lb

## 2021-03-03 DIAGNOSIS — O99019 Anemia complicating pregnancy, unspecified trimester: Secondary | ICD-10-CM

## 2021-03-03 DIAGNOSIS — R87610 Atypical squamous cells of undetermined significance on cytologic smear of cervix (ASC-US): Secondary | ICD-10-CM

## 2021-03-03 DIAGNOSIS — R87810 Cervical high risk human papillomavirus (HPV) DNA test positive: Secondary | ICD-10-CM

## 2021-03-03 DIAGNOSIS — Z3480 Encounter for supervision of other normal pregnancy, unspecified trimester: Secondary | ICD-10-CM

## 2021-03-03 NOTE — Patient Instructions (Signed)
Compression stockings/TED hoses.   Pregnancy belt

## 2021-03-03 NOTE — Progress Notes (Signed)
    PRENATAL VISIT NOTE  Subjective:  Brandi Austin is a 30 y.o. [phone] at [redacted]w[redacted]d being seen today for ongoing prenatal care.  She is currently monitored for the following issues for this low-risk pregnancy and has Supervision of other normal pregnancy, antepartum; Antepartum anemia; and Atypical squamous cell changes of undetermined significance (ASCUS) on cervical cytology with positive high risk human papilloma virus (HPV) on their problem list.  Patient reports no complaints.  Contractions: Not present. Vag. Bleeding: None.  Movement: Present. Denies leaking of fluid.   The following portions of the patient's history were reviewed and updated as appropriate: allergies, current medications, past family history, past medical history, past social history, past surgical history and problem list.   Objective:   Vitals:   03/03/21 1544  BP: 127/71  Pulse: 82  Weight: 176 lb 11.2 oz (80.2 kg)    Fetal Status: Fetal Heart Rate (bpm): 143   Movement: Present     General:  Alert, oriented and cooperative. Patient is in no acute distress.  Skin: Skin is warm and dry. No rash noted.   Cardiovascular: Normal heart rate noted  Respiratory: Normal respiratory effort, no problems with respiration noted  Abdomen: Soft, gravid, appropriate for gestational age.  Pain/Pressure: Present     Pelvic: Cervical exam deferred        Extremities: Normal range of motion.  Edema: Trace  Mental Status: Normal mood and affect. Normal behavior. Normal judgment and thought content.   Assessment and Plan:  Pregnancy: [id number] at [redacted]w[redacted]d 1. Supervision of other normal pregnancy, antepartum Follow up 9/26 anatomy u/s - AFP, Serum, Open Spina Bifida  2. Atypical squamous cell changes of undetermined significance (ASCUS) on cervical cytology with positive high risk human papilloma virus (HPV) Pt states she is going to apply on Monday for insurance Needs rpt pap this pregnancy  3. Antepartum  anemia Recheck with 28wk labs  Preterm labor symptoms and general obstetric precautions including but not limited to vaginal bleeding, contractions, leaking of fluid and fetal movement were reviewed in detail with the patient. Please refer to After Visit Summary for other counseling recommendations.   Return in about 1 month (around 04/02/2021) for low risk ob, md or app, in person or virtual.  Future Appointments  Date Time Provider Department Center  03/07/2021  9:00 AM [facility] US1 [facility]  04/04/2021  8:15 AM Warner Mccreedy, MD [facility]    [employer], MD

## 2021-03-05 LAB — AFP, SERUM, OPEN SPINA BIFIDA
AFP MoM: 0.93
AFP Value: 39.6 ng/mL
Gest. Age on Collection Date: 18.3 weeks
Maternal Age At EDD: 28.3 yr
OSBR Risk 1 IN: 10000
Test Results:: NEGATIVE
Weight: 177 [lb_av]

## 2021-03-07 ENCOUNTER — Ambulatory Visit: Payer: BC Managed Care – PPO | Attending: Certified Nurse Midwife

## 2021-03-07 ENCOUNTER — Other Ambulatory Visit: Payer: Self-pay | Admitting: Certified Nurse Midwife

## 2021-03-07 ENCOUNTER — Telehealth: Payer: Self-pay

## 2021-03-07 ENCOUNTER — Other Ambulatory Visit: Payer: Self-pay

## 2021-03-07 DIAGNOSIS — Z3480 Encounter for supervision of other normal pregnancy, unspecified trimester: Secondary | ICD-10-CM | POA: Insufficient documentation

## 2021-03-07 NOTE — Telephone Encounter (Signed)
Sent work letter to patient through my chart

## 2021-03-08 ENCOUNTER — Other Ambulatory Visit: Payer: Self-pay | Admitting: *Deleted

## 2021-03-08 DIAGNOSIS — Z362 Encounter for other antenatal screening follow-up: Secondary | ICD-10-CM

## 2021-04-04 ENCOUNTER — Ambulatory Visit: Payer: BC Managed Care – PPO | Admitting: *Deleted

## 2021-04-04 ENCOUNTER — Ambulatory Visit (INDEPENDENT_AMBULATORY_CARE_PROVIDER_SITE_OTHER): Payer: Self-pay | Admitting: Family Medicine

## 2021-04-04 ENCOUNTER — Encounter: Payer: Self-pay | Admitting: Family Medicine

## 2021-04-04 ENCOUNTER — Ambulatory Visit: Payer: BC Managed Care – PPO | Attending: Obstetrics and Gynecology

## 2021-04-04 ENCOUNTER — Other Ambulatory Visit: Payer: Self-pay

## 2021-04-04 VITALS — BP 110/66 | HR 87 | Wt 179.1 lb

## 2021-04-04 VITALS — BP 102/53 | HR 73

## 2021-04-04 DIAGNOSIS — Z362 Encounter for other antenatal screening follow-up: Secondary | ICD-10-CM | POA: Insufficient documentation

## 2021-04-04 DIAGNOSIS — Z3A23 23 weeks gestation of pregnancy: Secondary | ICD-10-CM

## 2021-04-04 DIAGNOSIS — Z3480 Encounter for supervision of other normal pregnancy, unspecified trimester: Secondary | ICD-10-CM | POA: Insufficient documentation

## 2021-04-04 DIAGNOSIS — E669 Obesity, unspecified: Secondary | ICD-10-CM

## 2021-04-04 DIAGNOSIS — O99212 Obesity complicating pregnancy, second trimester: Secondary | ICD-10-CM

## 2021-04-04 NOTE — Progress Notes (Signed)
   Subjective:  Brandi Austin is a 30 y.o. [phone] at [redacted]w[redacted]d being seen today for ongoing prenatal care.  Brandi Austin is currently monitored for the following issues for this low-risk pregnancy and has Supervision of other normal pregnancy, antepartum; Antepartum anemia; and Atypical squamous cell changes of undetermined significance (ASCUS) on cervical cytology with positive high risk human papilloma virus (HPV) on their problem list.  Patient reports no complaints.  Contractions: Not present. Vag. Bleeding: None.  Movement: Present. Denies leaking of fluid.   The following portions of the patient's history were reviewed and updated as appropriate: allergies, current medications, past family history, past medical history, past social history, past surgical history and problem list. Problem list updated.  Objective:   Vitals:   04/04/21 0829  BP: 110/66  Pulse: 87  Weight: 179 lb 1.6 oz (81.2 kg)    Fetal Status: Fetal Heart Rate (bpm): 131   Movement: Present     General:  Alert, oriented and cooperative. Patient is in no acute distress.  Skin: Skin is warm and dry. No rash noted.   Cardiovascular: Normal heart rate noted  Respiratory: Normal respiratory effort, no problems with respiration noted  Abdomen: Soft, gravid, appropriate for gestational age. Pain/Pressure: Present     Pelvic: Vag. Bleeding: None     Cervical exam deferred        Extremities: Normal range of motion.  Edema: Trace  Mental Status: Normal mood and affect. Normal behavior. Normal judgment and thought content.     Assessment and Plan:  Pregnancy: [id number] at [redacted]w[redacted]d  1. Supervision of other normal pregnancy, antepartum 2. [redacted] weeks gestation of pregnancy Doing well. Appropriate fundal height and fetal heart rate. No concerns at this time. Has follow up anatomy scan later today - follow up in ~4 weeks and plan for 28 week labs at that time  3. Contraceptive counseling Discussed pregnancy spacing.  Previously used Nexplanon and pills, did not like Nexplanon. Patient considering pills for birth control. Still deciding. No questions at this time  4. Previous Abnormal PAP Due for repeat PAP. Was waiting for pregnancy medicaid to get established. Now states has insurance from her current job but Brandi Austin has not brought in card yet. Will bring in insurance card when Brandi Austin comes for [place] later today.  - Will plan for PAP once has insurance or in Post Partum period if too far advanced in pregnancy   Preterm labor symptoms and general obstetric precautions including but not limited to vaginal bleeding, contractions, leaking of fluid and fetal movement were reviewed in detail with the patient. Please refer to After Visit Summary for other counseling recommendations.  Return in about 5 weeks (around 05/09/2021), or Schedule at 28 weeks, for LROB any provider.  Warner Mccreedy, MD, MPH OB Fellow, Faculty Practice

## 2021-04-18 ENCOUNTER — Other Ambulatory Visit: Payer: Self-pay

## 2021-04-18 NOTE — Progress Notes (Signed)
Pt called into to ask for Dental letter due to toothache.  Spoke with pt. Pt given letter through [employer]. Pt verbalized understanding.  Judeth Cornfield, RN

## 2021-05-09 ENCOUNTER — Other Ambulatory Visit: Payer: Self-pay | Admitting: *Deleted

## 2021-05-09 DIAGNOSIS — Z3480 Encounter for supervision of other normal pregnancy, unspecified trimester: Secondary | ICD-10-CM

## 2021-05-10 ENCOUNTER — Ambulatory Visit (INDEPENDENT_AMBULATORY_CARE_PROVIDER_SITE_OTHER): Payer: Self-pay | Admitting: Obstetrics and Gynecology

## 2021-05-10 ENCOUNTER — Other Ambulatory Visit: Payer: Self-pay

## 2021-05-10 DIAGNOSIS — Z3480 Encounter for supervision of other normal pregnancy, unspecified trimester: Secondary | ICD-10-CM

## 2021-05-10 NOTE — Progress Notes (Signed)
   PRENATAL VISIT NOTE  Subjective:  Brandi Austin is a 30 y.o. [phone] at [redacted]w[redacted]d being seen today for ongoing prenatal care.  She is currently monitored for the following issues for this low-risk pregnancy and has Supervision of other normal pregnancy, antepartum; Antepartum anemia; and Atypical squamous cell changes of undetermined significance (ASCUS) on cervical cytology with positive high risk human papilloma virus (HPV) on their problem list.  Patient reports no complaints.  Contractions: Irritability. Vag. Bleeding: None.  Movement: Present. Denies leaking of fluid.   The following portions of the patient's history were reviewed and updated as appropriate: allergies, current medications, past family history, past medical history, past social history, past surgical history and problem list.   Objective:   Vitals:   05/10/21 0851  BP: 123/69  Pulse: 73  Weight: 182 lb (82.6 kg)    Fetal Status: Fetal Heart Rate (bpm): 140 Fundal Height: 29 cm Movement: Present     General:  Alert, oriented and cooperative. Patient is in no acute distress.  Skin: Skin is warm and dry. No rash noted.   Cardiovascular: Normal heart rate noted  Respiratory: Normal respiratory effort, no problems with respiration noted  Abdomen: Soft, gravid, appropriate for gestational age.  Pain/Pressure: Present     Pelvic: Cervical exam deferred        Extremities: Normal range of motion.  Edema: Trace  Mental Status: Normal mood and affect. Normal behavior. Normal judgment and thought content.   Assessment and Plan:  Pregnancy: [id number] at [redacted]w[redacted]d 1. Supervision of other normal pregnancy, antepartum  Doing well TDAP at the [facility], She is self pay  Term labor symptoms and general obstetric precautions including but not limited to vaginal bleeding, contractions, leaking of fluid and fetal movement were reviewed in detail with the patient. Please refer to After Visit Summary for other counseling  recommendations.   No follow-ups on file.  No future appointments.  Venia Carbon, NP

## 2021-05-11 LAB — CBC
Hematocrit: 32.5 % — ABNORMAL LOW (ref 34.0–46.6)
Hemoglobin: 10.2 g/dL — ABNORMAL LOW (ref 11.1–15.9)
MCH: 23.6 pg — ABNORMAL LOW (ref 26.6–33.0)
MCHC: 31.4 g/dL — ABNORMAL LOW (ref 31.5–35.7)
MCV: 75 fL — ABNORMAL LOW (ref 79–97)
Platelets: 199 10*3/uL (ref 150–450)
RBC: 4.32 x10E6/uL (ref 3.77–5.28)
RDW: 18.9 % — ABNORMAL HIGH (ref 11.7–15.4)
WBC: 9.6 10*3/uL (ref 3.4–10.8)

## 2021-05-11 LAB — RPR: RPR Ser Ql: NONREACTIVE

## 2021-05-11 LAB — GLUCOSE TOLERANCE, 2 HOURS W/ 1HR
Glucose, 1 hour: 139 mg/dL (ref 70–179)
Glucose, 2 hour: 128 mg/dL (ref 70–152)
Glucose, Fasting: 104 mg/dL — ABNORMAL HIGH (ref 70–91)

## 2021-05-11 LAB — HIV ANTIBODY (ROUTINE TESTING W REFLEX): HIV Screen 4th Generation wRfx: NONREACTIVE

## 2021-05-16 ENCOUNTER — Other Ambulatory Visit: Payer: Self-pay | Admitting: Obstetrics and Gynecology

## 2021-05-16 DIAGNOSIS — O24410 Gestational diabetes mellitus in pregnancy, diet controlled: Secondary | ICD-10-CM

## 2021-05-16 NOTE — Progress Notes (Signed)
Failed 2 hour GTT. Referral sent for diabetes management.  Duane Lope, NP 05/16/2021 6:29 PM

## 2021-05-31 ENCOUNTER — Other Ambulatory Visit: Payer: Self-pay

## 2021-05-31 ENCOUNTER — Ambulatory Visit (INDEPENDENT_AMBULATORY_CARE_PROVIDER_SITE_OTHER): Payer: Self-pay | Admitting: Student

## 2021-05-31 VITALS — BP 114/71 | HR 97 | Wt 178.0 lb

## 2021-05-31 DIAGNOSIS — O26843 Uterine size-date discrepancy, third trimester: Secondary | ICD-10-CM

## 2021-05-31 DIAGNOSIS — Z3A31 31 weeks gestation of pregnancy: Secondary | ICD-10-CM

## 2021-05-31 DIAGNOSIS — Z3480 Encounter for supervision of other normal pregnancy, unspecified trimester: Secondary | ICD-10-CM

## 2021-05-31 NOTE — Progress Notes (Signed)
° °  PRENATAL VISIT NOTE  Subjective:  Brandi Austin is a 30 y.o. [phone] at [redacted]w[redacted]d being seen today for ongoing prenatal care.  She is currently monitored for the following issues for this low-risk pregnancy and has Supervision of other normal pregnancy, antepartum; Antepartum anemia; and Atypical squamous cell changes of undetermined significance (ASCUS) on cervical cytology with positive high risk human papilloma virus (HPV) on their problem list.  Patient reports no complaints.  Contractions: Irritability. Vag. Bleeding: None.  Movement: Present. Denies leaking of fluid.   The following portions of the patient's history were reviewed and updated as appropriate: allergies, current medications, past family history, past medical history, past social history, past surgical history and problem list.   Objective:   Vitals:   05/31/21 1348  BP: 114/71  Pulse: 97  Weight: 178 lb (80.7 kg)    Fetal Status: Fetal Heart Rate (bpm): 150 Fundal Height: 34 cm Movement: Present     General:  Alert, oriented and cooperative. Patient is in no acute distress.  Skin: Skin is warm and dry. No rash noted.   Cardiovascular: Normal heart rate noted  Respiratory: Normal respiratory effort, no problems with respiration noted  Abdomen: Soft, gravid, appropriate for gestational age.  Pain/Pressure: Present     Pelvic: Cervical exam deferred        Extremities: Normal range of motion.  Edema: Trace  Mental Status: Normal mood and affect. Normal behavior. Normal judgment and thought content.   Assessment and Plan:  Pregnancy: [id number] at [redacted]w[redacted]d  1. Uterine size date discrepancy pregnancy, third trimester   2. [redacted] weeks gestation of pregnancy   3. Supervision of other normal pregnancy, antepartum    -declines birth control; she would like to have more kids -patient is going to have repeat 2 hour GTT because she was not fasting -will repeat US due to large fundal height and maternal  obestiy  Preterm labor symptoms and general obstetric precautions including but not limited to vaginal bleeding, contractions, leaking of fluid and fetal movement were reviewed in detail with the patient. Please refer to After Visit Summary for other counseling recommendations.   Return in about 2 weeks (around 06/14/2021).  No future appointments.  Marylene Land, CNM

## 2021-05-31 NOTE — Progress Notes (Signed)
Patient follow up ultrasound scheduled for 06/24/20 at 0730. Patient notified.

## 2021-06-02 ENCOUNTER — Other Ambulatory Visit: Payer: Self-pay

## 2021-06-02 DIAGNOSIS — Z3480 Encounter for supervision of other normal pregnancy, unspecified trimester: Secondary | ICD-10-CM

## 2021-06-03 ENCOUNTER — Other Ambulatory Visit: Payer: Self-pay

## 2021-06-03 LAB — GLUCOSE TOLERANCE, 2 HOURS W/ 1HR
Glucose, 1 hour: 207 mg/dL — ABNORMAL HIGH (ref 70–179)
Glucose, 2 hour: 138 mg/dL (ref 70–152)
Glucose, Fasting: 98 mg/dL — ABNORMAL HIGH (ref 70–91)

## 2021-06-08 ENCOUNTER — Telehealth: Payer: Self-pay | Admitting: General Practice

## 2021-06-08 NOTE — Telephone Encounter (Signed)
[facility] @ 9am. Called patient, no answer- left message to call us back concerning test results and an appt. Will send mychart message

## 2021-06-08 NOTE — Telephone Encounter (Signed)
-----   Message from Marylene Land, CNM sent at 06/08/2021  9:03 AM EST ----- Hello! This patient has GDM; are you able to call her and let her know, plus set her up for education, antenatal testing, etc? She is Spanish-speaking. Thank you!

## 2021-06-10 ENCOUNTER — Encounter: Payer: Self-pay | Admitting: Registered"

## 2021-06-12 NOTE — L&D Delivery Note (Addendum)
OB/GYN Faculty Practice Delivery Note  Brandi Austin is a 31 y.o. [id number] s/p SVD at [redacted]w[redacted]d. She was admitted for SOL.   ROM: 2h 41m with meconium stain fluid GBS Status: negative Maximum Maternal Temperature: 98.5  Labor Progress: Pt present to L&D in spontaneous labor, AROM with bloody show later thin meconium, progressed to complete without augmentation.  Delivery Date/Time: 07/28/2021 @ 2048  Delivery: Called to room, patient was complete. Starting pushing with good effort. After a 18 minute 2nd stage,  a viable, NBM was delivered over intact perineum. Head delivered ROA. No nuchal cord present. Shoulder and body delivered in usual fashion. Infant with spontaneous cry, placed on mother's abdomen, dried and stimulated. Cord clamped x 2 after 1-minute delay, and cut by dad. Cord blood drawn. The placenta separated spontaneously and delivered via CCT and maternal pushing effort.  It was inspected and appears to be intact with a 3 VC.  Fundus firm with massage and Pitocin. Labia, perineum, vagina, and cervix inspected inspected with 2nd degree vaginal laceration that was repaired in continuous locked fashion.   Placenta: 3 VC, intact and to pathology d/t meconium stained fluid Complications: none Lacerations: 2nd degree vaginal  EBL: 100 cc Analgesia: Epidural  Postpartum Planning Mom to postpartum.  Baby to Couplet care / Skin to Skin, routine postpartum care.  Infant: APGARs 8/9   WEIGHT PENDING**  Brandi Austin, SNM, [facility]U  The above was performed under my direct supervision and guidance.

## 2021-06-16 ENCOUNTER — Other Ambulatory Visit: Payer: Self-pay

## 2021-06-16 ENCOUNTER — Ambulatory Visit (INDEPENDENT_AMBULATORY_CARE_PROVIDER_SITE_OTHER): Payer: Self-pay | Admitting: Family Medicine

## 2021-06-16 VITALS — BP 111/73 | HR 84 | Wt 184.0 lb

## 2021-06-16 DIAGNOSIS — Z789 Other specified health status: Secondary | ICD-10-CM | POA: Insufficient documentation

## 2021-06-16 DIAGNOSIS — Z3480 Encounter for supervision of other normal pregnancy, unspecified trimester: Secondary | ICD-10-CM

## 2021-06-16 DIAGNOSIS — O24410 Gestational diabetes mellitus in pregnancy, diet controlled: Secondary | ICD-10-CM

## 2021-06-16 DIAGNOSIS — O24419 Gestational diabetes mellitus in pregnancy, unspecified control: Secondary | ICD-10-CM | POA: Insufficient documentation

## 2021-06-16 DIAGNOSIS — Z23 Encounter for immunization: Secondary | ICD-10-CM

## 2021-06-16 HISTORY — DX: Gestational diabetes mellitus in pregnancy, unspecified control: O24.419

## 2021-06-16 NOTE — Patient Instructions (Addendum)
Following an appropriate diet and keeping your blood sugar under control is the most important thing to do for your health and that of your unborn baby.  Please check your blood sugar 4 times daily.  Please keep accurate BS logs and bring them with you to every visit.  Please bring your meter also.  Goals for Blood sugar should be: 1. Fasting (first thing in the morning before eating) should be less than 90.   2.  2 hours after meals should be less than 120.  Please eat 3 meals and 3 snacks.  Include protein (meat, dairy-cheese, eggs, nuts) with all meals.  Be mindful that carbohydrates increase your blood sugar.  Not just sweet food (cookies, cake, donuts, fruit, juice, soda) but also bread, pasta, rice, and potatoes.  You have to limit how many carbs you are eating.  Adding exercise, as little as 30 minutes a day can decrease your blood sugar.   Third Trimester of Pregnancy The third trimester of pregnancy is from week 28 through week 27. This is months 7 through 9. The third trimester is a time when the unborn baby (fetus) is growing rapidly. At the end of the ninth month, the fetus is about 20 inches long and weighs 6-10 pounds. Body changes during your third trimester During the third trimester, your body will continue to go through many changes. The changes vary and generally return to normal after your baby is born. Physical changes Your weight will continue to increase. You can expect to gain 25-35 pounds (11-16 kg) by the end of the pregnancy if you begin pregnancy at a normal weight. If you are underweight, you can expect to gain 28-40 lb (about 13-18 kg), and if you are overweight, you can expect to gain 15-25 lb (about 7-11 kg). You may begin to get stretch marks on your hips, abdomen, and breasts. Your breasts will continue to grow and may hurt. A yellow fluid (colostrum) may leak from your breasts. This is the first milk you are producing for your baby. You may have changes in  your hair. These can include thickening of your hair, rapid growth, and changes in texture. Some people also have hair loss during or after pregnancy, or hair that feels dry or thin. Your belly button may stick out. You may notice more swelling in your hands, face, or ankles. Health changes You may have heartburn. You may have constipation. You may develop hemorrhoids. You may develop swollen, bulging veins (varicose veins) in your legs. You may have increased body aches in the pelvis, back, or thighs. This is due to weight gain and increased hormones that are relaxing your joints. You may have increased tingling or numbness in your hands, arms, and legs. The skin on your abdomen may also feel numb. You may feel short of breath because of your expanding uterus. Other changes You may urinate more often because the fetus is moving lower into your pelvis and pressing on your bladder. You may have more problems sleeping. This may be caused by the size of your abdomen, an increased need to urinate, and an increase in your body's metabolism. You may notice the fetus "dropping," or moving lower in your abdomen (lightening). You may have increased vaginal discharge. You may notice that you have pain around your pelvic bone as your uterus distends. Follow these instructions at home: Medicines Follow your health care provider's instructions regarding medicine use. Specific medicines may be either safe or unsafe to take during pregnancy.  Do not take any medicines unless approved by your health care provider. Take a prenatal vitamin that contains at least 600 micrograms (mcg) of folic acid. Eating and drinking Eat a healthy diet that includes fresh fruits and vegetables, whole grains, good sources of protein such as meat, eggs, or tofu, and low-fat dairy products. Avoid raw meat and unpasteurized juice, milk, and cheese. These carry germs that can harm you and your baby. Eat 4 or 5 small meals rather  than 3 large meals a day. You may need to take these actions to prevent or treat constipation: Drink enough fluid to keep your urine pale yellow. Eat foods that are high in fiber, such as beans, whole grains, and fresh fruits and vegetables. Limit foods that are high in fat and processed sugars, such as fried or sweet foods. Activity Exercise only as directed by your health care provider. Most people can continue their usual exercise routine during pregnancy. Try to exercise for 30 minutes at least 5 days a week. Stop exercising if you experience contractions in the uterus. Stop exercising if you develop pain or cramping in the lower abdomen or lower back. Avoid heavy lifting. Do not exercise if it is very hot or humid or if you are at a high altitude. If you choose to, you may continue to have sex unless your health care provider tells you not to. Relieving pain and discomfort Take frequent breaks and rest with your legs raised (elevated) if you have leg cramps or low back pain. Take warm sitz baths to soothe any pain or discomfort caused by hemorrhoids. Use hemorrhoid cream if your health care provider approves. Wear a supportive bra to prevent discomfort from breast tenderness. If you develop varicose veins: Wear support hose as told by your health care provider. Elevate your feet for 15 minutes, 3-4 times a day. Limit salt in your diet. Safety Talk to your health care provider before traveling far distances. Do not use hot tubs, steam rooms, or saunas. Wear your seat belt at all times when driving or riding in a car. Talk with your health care provider if someone is verbally or physically abusive to you. Preparing for birth To prepare for the arrival of your baby: Take prenatal classes to understand, practice, and ask questions about labor and delivery. Visit the hospital and tour the maternity area. Purchase a rear-facing car seat and make sure you know how to install it in your  car. Prepare the baby's room or sleeping area. Make sure to remove all pillows and stuffed animals from the baby's crib to prevent suffocation. General instructions Avoid cat litter boxes and soil used by cats. These carry germs that can cause birth defects in the baby. If you have a cat, ask someone to clean the litter box for you. Do not douche or use tampons. Do not use scented sanitary pads. Do not use any products that contain nicotine or tobacco, such as cigarettes, e-cigarettes, and chewing tobacco. If you need help quitting, ask your health care provider. Do not use any herbal remedies, illegal drugs, or medicines that were not prescribed to you. Chemicals in these products can harm your baby. Do not drink alcohol. You will have more frequent prenatal exams during the third trimester. During a routine prenatal visit, your health care provider will do a physical exam, perform tests, and discuss your overall health. Keep all follow-up visits. This is important. Where to find more information American Pregnancy Association: americanpregnancy.[place]  and Gynecologists: PoolDevices.com.pt Office on Women's Health: KeywordPortfolios.com.br Contact a health care provider if you have: A fever. Mild pelvic cramps, pelvic pressure, or nagging pain in your abdominal area or lower back. Vomiting or diarrhea. Bad-smelling vaginal discharge or foul-smelling urine. Pain when you urinate. A headache that does not go away when you take medicine. Visual changes or see spots in front of your eyes. Get help right away if: Your water breaks. You have regular contractions less than 5 minutes apart. You have spotting or bleeding from your vagina. You have severe abdominal pain. You have difficulty breathing. You have chest pain. You have fainting spells. You have not felt your baby move for the time period told by your health care provider. You have  new or increased pain, swelling, or redness in an arm or leg. Summary The third trimester of pregnancy is from week 28 through week 40 (months 7 through 9). You may have more problems sleeping. This can be caused by the size of your abdomen, an increased need to urinate, and an increase in your body's metabolism. You will have more frequent prenatal exams during the third trimester. Keep all follow-up visits. This is important. This information is not intended to replace advice given to you by your health care provider. Make sure you discuss any questions you have with your health care provider. Document Revised: 11/05/2019 Document Reviewed: 09/11/2019 Elsevier Patient Education  2022 [employer].

## 2021-06-16 NOTE — Progress Notes (Signed)
° °  PRENATAL VISIT NOTE  Subjective:  Brandi Austin is a 31 y.o. [phone] at [redacted]w[redacted]d being seen today for ongoing prenatal care.  She is currently monitored for the following issues for this high-risk pregnancy and has Supervision of other normal pregnancy, antepartum; Antepartum anemia; Atypical squamous cell changes of undetermined significance (ASCUS) on cervical cytology with positive high risk human papilloma virus (HPV); Gestational diabetes; and Language barrier on their problem list.  Patient reports no complaints.  Contractions: Not present. Vag. Bleeding: None.  Movement: Present. Denies leaking of fluid.   The following portions of the patient's history were reviewed and updated as appropriate: allergies, current medications, past family history, past medical history, past social history, past surgical history and problem list.   Objective:   Vitals:   06/16/21 1335  BP: 111/73  Pulse: 84  Weight: 184 lb (83.5 kg)    Fetal Status: Fetal Heart Rate (bpm): 123 Fundal Height: 34 cm Movement: Present     General:  Alert, oriented and cooperative. Patient is in no acute distress.  Skin: Skin is warm and dry. No rash noted.   Cardiovascular: Normal heart rate noted  Respiratory: Normal respiratory effort, no problems with respiration noted  Abdomen: Soft, gravid, appropriate for gestational age.  Pain/Pressure: Absent     Pelvic: Cervical exam deferred        Extremities: Normal range of motion.  Edema: Trace  Mental Status: Normal mood and affect. Normal behavior. Normal judgment and thought content.   Assessment and Plan:  Pregnancy: [id number] at [redacted]w[redacted]d 1. Supervision of other normal pregnancy, antepartum  - Tdap vaccine greater than or equal to 7yo IM  2. Diet controlled gestational diabetes mellitus (GDM) in third trimester Has an appointment with [facility] mg[facility] on 1/17. Given written and verbal instructions per AVS.    Preterm labor symptoms and general  obstetric precautions including but not limited to vaginal bleeding, contractions, leaking of fluid and fetal movement were reviewed in detail with the patient. Please refer to After Visit Summary for other counseling recommendations.   Return in 2 weeks (on 06/30/2021).  Future Appointments  Date Time Provider Department Center  06/24/2021  7:30 AM [facility] NURSE [facility]  06/24/2021  7:45 AM [facility] US4 [facility]  06/28/2021  8:15 AM [facility]    Reva Bores, MD

## 2021-06-21 ENCOUNTER — Other Ambulatory Visit: Payer: Self-pay

## 2021-06-24 ENCOUNTER — Ambulatory Visit: Payer: Self-pay

## 2021-06-28 ENCOUNTER — Encounter: Payer: Self-pay | Attending: Obstetrics and Gynecology | Admitting: Registered"

## 2021-06-28 ENCOUNTER — Ambulatory Visit (INDEPENDENT_AMBULATORY_CARE_PROVIDER_SITE_OTHER): Payer: Self-pay | Admitting: Registered"

## 2021-06-28 ENCOUNTER — Other Ambulatory Visit: Payer: Self-pay

## 2021-06-28 DIAGNOSIS — O24410 Gestational diabetes mellitus in pregnancy, diet controlled: Secondary | ICD-10-CM

## 2021-06-28 DIAGNOSIS — Z3A00 Weeks of gestation of pregnancy not specified: Secondary | ICD-10-CM | POA: Insufficient documentation

## 2021-06-28 NOTE — Progress Notes (Signed)
Patient was seen for Gestational Diabetes self-management on 06/28/21  Start time 0835 and End time 0916 (41 minutes)  Estimated due date: 08/01/21; [redacted]w[redacted]d  Clinical: Medications: reviewed Medical History: reviewed Labs: OGTT FBS 98, 1 hr 207, A1c 5.0%   Dietary and Lifestyle History: Pt states when she was working would eat on a structured scheduled with meals and snacks. When stopped working x6 weeks, started having insomnia and waking up late and not having appetite during the day. Pt states she is having SOB with movement and states happens when she hasn't eaten.  Pt states sometimes has vegetables with dinner.  Physical Activity: ADL - house chores and taking care of 2 yr old daughter  Stress: not assessed Sleep: not assessed  24 hr Recall:  First Meal: (usually sleeping until afternoon) Snack: Second meal: cheerios, 2% milk Snack: mandarins, bananas OR goldfish Third meal: eggs, quesadilla  Snack: fruit Beverages: water, juice (cranberry, orange or apple juice ~6 oz)  NUTRITION INTERVENTION  Nutrition education (E-1) on the following topics:   Initial Follow-up  [x]  []  Definition of Gestational Diabetes [x]  []  Why dietary management is important in controlling blood glucose [x]  []  Effects each nutrient has on blood glucose levels [x]  []  Simple carbohydrates vs complex carbohydrates [x]  []  Fluid intake [x]  []  Creating a balanced meal plan [x]  []  Carbohydrate counting  [x]  []  When to check blood glucose levels [x]  []  Proper blood glucose monitoring techniques [x]  []  Effect of stress and stress reduction techniques  [x]  []  Exercise effect on blood glucose levels, appropriate exercise during pregnancy [x]  []  Importance of limiting caffeine and abstaining from alcohol and smoking [x]  []  Medications used for blood sugar control during pregnancy [x]  []  Hypoglycemia and rule of 15 [x]  []  Postpartum self care  Blood glucose monitor given: Prodigy   Patient instructed  to monitor glucose levels: FBS: 60 - ? 95 mg/dL (some clinics use 90 for cutoff) 1 hour: ? 140 mg/dL 2 hour: ? mg/dL  Patient received handouts: Nutrition Diabetes and Pregnancy Carbohydrate Counting List  Patient will be seen for follow-up as needed.

## 2021-06-29 ENCOUNTER — Encounter: Payer: Self-pay | Admitting: *Deleted

## 2021-06-29 ENCOUNTER — Ambulatory Visit (HOSPITAL_BASED_OUTPATIENT_CLINIC_OR_DEPARTMENT_OTHER): Payer: Self-pay

## 2021-06-29 ENCOUNTER — Ambulatory Visit: Payer: Self-pay | Attending: Student | Admitting: *Deleted

## 2021-06-29 VITALS — BP 124/66 | HR 80

## 2021-06-29 DIAGNOSIS — E669 Obesity, unspecified: Secondary | ICD-10-CM

## 2021-06-29 DIAGNOSIS — O99213 Obesity complicating pregnancy, third trimester: Secondary | ICD-10-CM | POA: Insufficient documentation

## 2021-06-29 DIAGNOSIS — O26843 Uterine size-date discrepancy, third trimester: Secondary | ICD-10-CM | POA: Insufficient documentation

## 2021-06-29 DIAGNOSIS — Z3480 Encounter for supervision of other normal pregnancy, unspecified trimester: Secondary | ICD-10-CM

## 2021-06-29 DIAGNOSIS — O24410 Gestational diabetes mellitus in pregnancy, diet controlled: Secondary | ICD-10-CM | POA: Insufficient documentation

## 2021-06-29 DIAGNOSIS — Z3A35 35 weeks gestation of pregnancy: Secondary | ICD-10-CM | POA: Insufficient documentation

## 2021-06-30 ENCOUNTER — Encounter: Payer: Self-pay | Admitting: Obstetrics & Gynecology

## 2021-06-30 ENCOUNTER — Other Ambulatory Visit: Payer: Self-pay

## 2021-06-30 ENCOUNTER — Ambulatory Visit (INDEPENDENT_AMBULATORY_CARE_PROVIDER_SITE_OTHER): Payer: Self-pay | Admitting: Family Medicine

## 2021-06-30 VITALS — BP 115/71 | HR 87 | Wt 191.9 lb

## 2021-06-30 DIAGNOSIS — Z3480 Encounter for supervision of other normal pregnancy, unspecified trimester: Secondary | ICD-10-CM

## 2021-06-30 DIAGNOSIS — O24410 Gestational diabetes mellitus in pregnancy, diet controlled: Secondary | ICD-10-CM

## 2021-06-30 NOTE — Progress Notes (Signed)
Pt states Fasting was 102 * breakfast 98, just started logging today.

## 2021-06-30 NOTE — Progress Notes (Signed)
° ° °  PRENATAL VISIT NOTE  Subjective:  Brandi Austin is a 31 y.o. [phone] at [redacted]w[redacted]d being seen today for ongoing prenatal care.  She is currently monitored for the following issues for this high-risk pregnancy and has Supervision of other normal pregnancy, antepartum; Antepartum anemia; Atypical squamous cell changes of undetermined significance (ASCUS) on cervical cytology with positive high risk human papilloma virus (HPV); and Gestational diabetes on their problem list.  Patient reports no complaints.  Contractions: Irritability. Vag. Bleeding: None.  Movement: Present. Denies leaking of fluid.   The following portions of the patient's history were reviewed and updated as appropriate: allergies, current medications, past family history, past medical history, past social history, past surgical history and problem list.   Objective:   Vitals:   06/30/21 1136  BP: 115/71  Pulse: 87  Weight: 191 lb 14.4 oz (87 kg)    Fetal Status: Fetal Heart Rate (bpm): 141 Fundal Height: 36 cm Movement: Present     General:  Alert, oriented and cooperative. Patient is in no acute distress.  Skin: Skin is warm and dry. No rash noted.   Cardiovascular: Normal heart rate noted  Respiratory: Normal respiratory effort, no problems with respiration noted  Abdomen: Soft, gravid, appropriate for gestational age.  Pain/Pressure: Absent     Pelvic: Cervical exam deferred        Extremities: Normal range of motion.  Edema: Trace  Mental Status: Normal mood and affect. Normal behavior. Normal judgment and thought content.   Assessment and Plan:  Pregnancy: [id number] at [redacted]w[redacted]d 1. Diet controlled gestational diabetes mellitus (GDM) in third trimester Only has checked 2 cbgs. Fasting is not a true fasting today at 102, Had a late snack. No log today--advised to bring. Last u/s for growth on 1/18 reveals 3151 gms, 6 lb 15 oz, 93%, vtx, normal fluid Continue diet  2. Supervision of other normal  pregnancy, antepartum Cultures next week  Preterm labor symptoms and general obstetric precautions including but not limited to vaginal bleeding, contractions, leaking of fluid and fetal movement were reviewed in detail with the patient. Please refer to After Visit Summary for other counseling recommendations.   Return in about 1 week (around 07/07/2021) for [facility].  Future Appointments  Date Time Provider [place]  07/11/2021  8:15 AM Woodroe Mode, MD [facility]  07/18/2021 10:55 AM Woodroe Mode, MD [facility]    Donnamae Jude, MD

## 2021-07-04 ENCOUNTER — Other Ambulatory Visit: Payer: Self-pay

## 2021-07-11 ENCOUNTER — Ambulatory Visit (INDEPENDENT_AMBULATORY_CARE_PROVIDER_SITE_OTHER): Payer: Self-pay | Admitting: Obstetrics & Gynecology

## 2021-07-11 ENCOUNTER — Other Ambulatory Visit: Payer: Self-pay

## 2021-07-11 ENCOUNTER — Other Ambulatory Visit (HOSPITAL_COMMUNITY)
Admission: RE | Admit: 2021-07-11 | Discharge: 2021-07-11 | Disposition: A | Discharge status: Home / Self Care | Payer: Self-pay | Source: Ambulatory Visit | Attending: Obstetrics & Gynecology | Admitting: Obstetrics & Gynecology

## 2021-07-11 VITALS — BP 125/76 | HR 79 | Wt 193.8 lb

## 2021-07-11 DIAGNOSIS — O24410 Gestational diabetes mellitus in pregnancy, diet controlled: Secondary | ICD-10-CM

## 2021-07-11 DIAGNOSIS — Z3A37 37 weeks gestation of pregnancy: Secondary | ICD-10-CM

## 2021-07-11 DIAGNOSIS — Z3493 Encounter for supervision of normal pregnancy, unspecified, third trimester: Secondary | ICD-10-CM | POA: Insufficient documentation

## 2021-07-11 DIAGNOSIS — Z3480 Encounter for supervision of other normal pregnancy, unspecified trimester: Secondary | ICD-10-CM

## 2021-07-11 NOTE — Progress Notes (Signed)
° °  PRENATAL VISIT NOTE  Subjective:  Brandi Austin is a 31 y.o. [phone] at [redacted]w[redacted]d being seen today for ongoing prenatal care.  She is currently monitored for the following issues for this high-risk pregnancy and has Supervision of other normal pregnancy, antepartum; Antepartum anemia; Atypical squamous cell changes of undetermined significance (ASCUS) on cervical cytology with positive high risk human papilloma virus (HPV); and Gestational diabetes on their problem list.  Patient reports occasional contractions.  Contractions: Irritability. Vag. Bleeding: None.  Movement: Present. Denies leaking of fluid.   The following portions of the patient's history were reviewed and updated as appropriate: allergies, current medications, past family history, past medical history, past social history, past surgical history and problem list.   Objective:   Vitals:   07/11/21 0832  BP: 125/76  Pulse: 79  Weight: 193 lb 12.8 oz (87.9 kg)    Fetal Status: Fetal Heart Rate (bpm): 140   Movement: Present     General:  Alert, oriented and cooperative. Patient is in no acute distress.  Skin: Skin is warm and dry. No rash noted.   Cardiovascular: Normal heart rate noted  Respiratory: Normal respiratory effort, no problems with respiration noted  Abdomen: Soft, gravid, appropriate for gestational age.  Pain/Pressure: Present     Pelvic: Cervical exam performed in the presence of a chaperone        Extremities: Normal range of motion.  Edema: Trace  Mental Status: Normal mood and affect. Normal behavior. Normal judgment and thought content.   Assessment and Plan:  Pregnancy: G5P3013 at [redacted]w[redacted]d 1. [redacted] weeks gestation of pregnancy Routine testing - Culture, beta strep (group b only) - Cervicovaginal ancillary only( [place])  2. Diet controlled gestational diabetes mellitus (GDM) in third trimester 80% in range diet control  3. Supervision of other normal pregnancy,  antepartum   Term labor symptoms and general obstetric precautions including but not limited to vaginal bleeding, contractions, leaking of fluid and fetal movement were reviewed in detail with the patient. Please refer to After Visit Summary for other counseling recommendations.   Return in about 1 week (around 07/18/2021).  Future Appointments  Date Time Provider [place]  07/18/2021 10:55 AM Woodroe Mode, MD [facility]    Emeterio Reeve, MD

## 2021-07-14 LAB — CERVICOVAGINAL ANCILLARY ONLY
Chlamydia: NEGATIVE
Comment: NEGATIVE
Comment: NORMAL
Neisseria Gonorrhea: NEGATIVE

## 2021-07-15 LAB — CULTURE, BETA STREP (GROUP B ONLY): Strep Gp B Culture: NEGATIVE

## 2021-07-18 ENCOUNTER — Ambulatory Visit (INDEPENDENT_AMBULATORY_CARE_PROVIDER_SITE_OTHER): Payer: Self-pay | Admitting: Obstetrics & Gynecology

## 2021-07-18 ENCOUNTER — Other Ambulatory Visit: Payer: Self-pay

## 2021-07-18 VITALS — BP 122/76 | HR 85 | Wt 193.7 lb

## 2021-07-18 DIAGNOSIS — Z3480 Encounter for supervision of other normal pregnancy, unspecified trimester: Secondary | ICD-10-CM

## 2021-07-18 DIAGNOSIS — O24410 Gestational diabetes mellitus in pregnancy, diet controlled: Secondary | ICD-10-CM

## 2021-07-18 NOTE — Progress Notes (Signed)
Patient complains of contractions that are 10-15 minutes apart and. Each contractions last about 1-1 1/2 minute". She stated baby is still moving well and denies any vaginal bleeding/discharge

## 2021-07-18 NOTE — Progress Notes (Signed)
° °  PRENATAL VISIT NOTE  Subjective:  Brandi Austin is a 31 y.o. [phone] at [redacted]w[redacted]d being seen today for ongoing prenatal care.  She is currently monitored for the following issues for this high-risk pregnancy and has Supervision of other normal pregnancy, antepartum; Antepartum anemia; Atypical squamous cell changes of undetermined significance (ASCUS) on cervical cytology with positive high risk human papilloma virus (HPV); and Gestational diabetes on their problem list.  Patient reports contractions every 10 min.  Contractions: Irritability. Vag. Bleeding: None.  Movement: Present. Denies leaking of fluid.   The following portions of the patient's history were reviewed and updated as appropriate: allergies, current medications, past family history, past medical history, past social history, past surgical history and problem list.   Objective:   Vitals:   07/18/21 1108  BP: 122/76  Pulse: 85  Weight: 193 lb 11.2 oz (87.9 kg)    Fetal Status: Fetal Heart Rate (bpm): 137   Movement: Present  Presentation: Vertex  General:  Alert, oriented and cooperative. Patient is in no acute distress.  Skin: Skin is warm and dry. No rash noted.   Cardiovascular: Normal heart rate noted  Respiratory: Normal respiratory effort, no problems with respiration noted  Abdomen: Soft, gravid, appropriate for gestational age.  Pain/Pressure: Present     Pelvic: Cervical exam performed in the presence of a chaperone Dilation: 3 Effacement (%): 40 Station: -3  Extremities: Normal range of motion.     Mental Status: Normal mood and affect. Normal behavior. Normal judgment and thought content.   Assessment and Plan:  Pregnancy: G5P3013 at [redacted]w[redacted]d 1. Diet controlled gestational diabetes mellitus (GDM) in third trimester Adequate diet control  2. Supervision of other normal pregnancy, antepartum [place] 93% growtg  Term labor symptoms and general obstetric precautions including but not limited to  vaginal bleeding, contractions, leaking of fluid and fetal movement were reviewed in detail with the patient. Please refer to After Visit Summary for other counseling recommendations.   Return in about 1 week (around 07/25/2021).  No future appointments.  Emeterio Reeve, MD

## 2021-07-26 ENCOUNTER — Encounter: Payer: Self-pay | Admitting: Family Medicine

## 2021-07-28 ENCOUNTER — Inpatient Hospital Stay (HOSPITAL_COMMUNITY)
Admission: AD | Admit: 2021-07-28 | Discharge: 2021-07-30 | DRG: 807 | Disposition: A | Discharge status: Home / Self Care | Payer: Medicaid Other | Attending: Obstetrics and Gynecology | Admitting: Obstetrics and Gynecology

## 2021-07-28 ENCOUNTER — Inpatient Hospital Stay (HOSPITAL_COMMUNITY): Payer: Medicaid Other | Admitting: Anesthesiology

## 2021-07-28 ENCOUNTER — Other Ambulatory Visit: Payer: Self-pay

## 2021-07-28 ENCOUNTER — Encounter (HOSPITAL_COMMUNITY): Payer: Self-pay | Admitting: Obstetrics & Gynecology

## 2021-07-28 ENCOUNTER — Encounter: Payer: Self-pay | Admitting: Obstetrics and Gynecology

## 2021-07-28 ENCOUNTER — Ambulatory Visit (INDEPENDENT_AMBULATORY_CARE_PROVIDER_SITE_OTHER): Payer: Medicaid Other | Admitting: Obstetrics and Gynecology

## 2021-07-28 VITALS — BP 116/70 | HR 71 | Wt 196.0 lb

## 2021-07-28 DIAGNOSIS — R87810 Cervical high risk human papillomavirus (HPV) DNA test positive: Secondary | ICD-10-CM

## 2021-07-28 DIAGNOSIS — Z3A39 39 weeks gestation of pregnancy: Secondary | ICD-10-CM

## 2021-07-28 DIAGNOSIS — Z20822 Contact with and (suspected) exposure to covid-19: Secondary | ICD-10-CM | POA: Diagnosis present

## 2021-07-28 DIAGNOSIS — O9902 Anemia complicating childbirth: Secondary | ICD-10-CM | POA: Diagnosis present

## 2021-07-28 DIAGNOSIS — O24410 Gestational diabetes mellitus in pregnancy, diet controlled: Secondary | ICD-10-CM

## 2021-07-28 DIAGNOSIS — O24420 Gestational diabetes mellitus in childbirth, diet controlled: Secondary | ICD-10-CM | POA: Diagnosis present

## 2021-07-28 DIAGNOSIS — D509 Iron deficiency anemia, unspecified: Secondary | ICD-10-CM | POA: Diagnosis present

## 2021-07-28 DIAGNOSIS — Z3480 Encounter for supervision of other normal pregnancy, unspecified trimester: Secondary | ICD-10-CM

## 2021-07-28 DIAGNOSIS — O99214 Obesity complicating childbirth: Secondary | ICD-10-CM | POA: Diagnosis present

## 2021-07-28 DIAGNOSIS — R87610 Atypical squamous cells of undetermined significance on cytologic smear of cervix (ASC-US): Secondary | ICD-10-CM

## 2021-07-28 DIAGNOSIS — O26893 Other specified pregnancy related conditions, third trimester: Secondary | ICD-10-CM | POA: Diagnosis present

## 2021-07-28 LAB — CBC
HCT: 32 % — ABNORMAL LOW (ref 36.0–46.0)
Hemoglobin: 9.7 g/dL — ABNORMAL LOW (ref 12.0–15.0)
MCH: 22.1 pg — ABNORMAL LOW (ref 26.0–34.0)
MCHC: 30.3 g/dL (ref 30.0–36.0)
MCV: 73.1 fL — ABNORMAL LOW (ref 80.0–100.0)
Platelets: 208 10*3/uL (ref 150–400)
RBC: 4.38 MIL/uL (ref 3.87–5.11)
RDW: 17.3 % — ABNORMAL HIGH (ref 11.5–15.5)
WBC: 10.1 10*3/uL (ref 4.0–10.5)
nRBC: 0 % (ref 0.0–0.2)

## 2021-07-28 LAB — TYPE AND SCREEN
ABO/RH(D): O POS
Antibody Screen: NEGATIVE

## 2021-07-28 LAB — RESP PANEL BY RT-PCR (FLU A&B, COVID) ARPGX2
Influenza A by PCR: NEGATIVE
Influenza B by PCR: NEGATIVE
SARS Coronavirus 2 by RT PCR: NEGATIVE

## 2021-07-28 LAB — GLUCOSE, CAPILLARY
Glucose-Capillary: 93 mg/dL (ref 70–99)
Glucose-Capillary: 96 mg/dL (ref 70–99)

## 2021-07-28 MED ORDER — LACTATED RINGERS IV SOLN
500.0000 mL | INTRAVENOUS | Status: DC | PRN
  Administered 2021-07-28: 1000 mL via INTRAVENOUS

## 2021-07-28 MED ORDER — DIPHENHYDRAMINE HCL 25 MG PO CAPS: 25.0000 mg | ORAL_CAPSULE | Freq: Four times a day (QID) | ORAL | Status: DC | PRN

## 2021-07-28 MED ORDER — COCONUT OIL OIL
1.0000 | TOPICAL_OIL | Status: DC | PRN
Start: 2021-07-28 — End: 2021-07-30

## 2021-07-28 MED ORDER — LACTATED RINGERS IV SOLN: 500.0000 mL | Freq: Once | INTRAVENOUS | Status: DC

## 2021-07-28 MED ORDER — DIPHENHYDRAMINE HCL 50 MG/ML IJ SOLN: 12.5000 mg | INTRAMUSCULAR | Status: DC | PRN

## 2021-07-28 MED ORDER — FLEET ENEMA 7-19 GM/118ML RE ENEM: 1.0000 | ENEMA | Freq: Every day | RECTAL | Status: DC | PRN

## 2021-07-28 MED ORDER — ACETAMINOPHEN 325 MG PO TABS: 650.0000 mg | ORAL_TABLET | ORAL | Status: DC | PRN

## 2021-07-28 MED ORDER — BENZOCAINE-MENTHOL 20-0.5 % EX AERO
1.0000 "application " | INHALATION_SPRAY | CUTANEOUS | Status: DC | PRN
  Filled 2021-07-28: qty 56

## 2021-07-28 MED ORDER — FENTANYL CITRATE (PF) 100 MCG/2ML IJ SOLN: 50.0000 ug | INTRAMUSCULAR | Status: DC | PRN

## 2021-07-28 MED ORDER — PHENYLEPHRINE 40 MCG/ML (10ML) SYRINGE FOR IV PUSH (FOR BLOOD PRESSURE SUPPORT): 80.0000 ug | PREFILLED_SYRINGE | INTRAVENOUS | Status: DC | PRN

## 2021-07-28 MED ORDER — ONDANSETRON HCL 4 MG PO TABS: 4.0000 mg | ORAL_TABLET | ORAL | Status: DC | PRN

## 2021-07-28 MED ORDER — FENTANYL-BUPIVACAINE-NACL 0.5-0.125-0.9 MG/250ML-% EP SOLN
12.0000 mL/h | EPIDURAL | Status: DC | PRN
  Filled 2021-07-28: qty 250

## 2021-07-28 MED ORDER — MEASLES, MUMPS & RUBELLA VAC IJ SOLR: 0.5000 mL | Freq: Once | INTRAMUSCULAR | Status: DC

## 2021-07-28 MED ORDER — PHENYLEPHRINE 40 MCG/ML (10ML) SYRINGE FOR IV PUSH (FOR BLOOD PRESSURE SUPPORT)
80.0000 ug | PREFILLED_SYRINGE | INTRAVENOUS | Status: DC | PRN
  Filled 2021-07-28: qty 10

## 2021-07-28 MED ORDER — METHYLERGONOVINE MALEATE 0.2 MG PO TABS: 0.2000 mg | ORAL_TABLET | ORAL | Status: DC | PRN

## 2021-07-28 MED ORDER — SOD CITRATE-CITRIC ACID 500-334 MG/5ML PO SOLN: 30.0000 mL | ORAL | Status: DC | PRN

## 2021-07-28 MED ORDER — FERROUS SULFATE 325 (65 FE) MG PO TABS
325.0000 mg | ORAL_TABLET | ORAL | Status: DC
  Administered 2021-07-29: 325 mg via ORAL
  Filled 2021-07-28: qty 1

## 2021-07-28 MED ORDER — MEDROXYPROGESTERONE ACETATE 150 MG/ML IM SUSP
150.0000 mg | INTRAMUSCULAR | Status: DC | PRN
Start: 2021-07-28 — End: 2021-07-30

## 2021-07-28 MED ORDER — FENTANYL-BUPIVACAINE-NACL 0.5-0.125-0.9 MG/250ML-% EP SOLN
EPIDURAL | Status: DC | PRN
Start: 2021-07-28 — End: 2021-07-28
  Administered 2021-07-28: 12 mL/h via EPIDURAL

## 2021-07-28 MED ORDER — BISACODYL 10 MG RE SUPP
10.0000 mg | Freq: Every day | RECTAL | Status: DC | PRN
Start: 2021-07-28 — End: 2021-07-30

## 2021-07-28 MED ORDER — EPHEDRINE 5 MG/ML INJ: 10.0000 mg | INTRAVENOUS | Status: DC | PRN

## 2021-07-28 MED ORDER — DIBUCAINE (PERIANAL) 1 % EX OINT: 1.0000 "application " | TOPICAL_OINTMENT | CUTANEOUS | Status: DC | PRN

## 2021-07-28 MED ORDER — IBUPROFEN 600 MG PO TABS
600.0000 mg | ORAL_TABLET | Freq: Four times a day (QID) | ORAL | Status: DC
  Administered 2021-07-29 – 2021-07-30 (×6): 600 mg via ORAL
  Filled 2021-07-28 (×6): qty 1

## 2021-07-28 MED ORDER — PRENATAL MULTIVITAMIN CH
1.0000 | ORAL_TABLET | Freq: Every day | ORAL | Status: DC
  Administered 2021-07-29 – 2021-07-30 (×2): 1 via ORAL
  Filled 2021-07-28 (×2): qty 1

## 2021-07-28 MED ORDER — TETANUS-DIPHTH-ACELL PERTUSSIS 5-2.5-18.5 LF-MCG/0.5 IM SUSY: 0.5000 mL | PREFILLED_SYRINGE | Freq: Once | INTRAMUSCULAR | Status: DC

## 2021-07-28 MED ORDER — LACTATED RINGERS IV SOLN: INTRAVENOUS | Status: DC

## 2021-07-28 MED ORDER — OXYCODONE-ACETAMINOPHEN 5-325 MG PO TABS: 1.0000 | ORAL_TABLET | ORAL | Status: DC | PRN

## 2021-07-28 MED ORDER — OXYTOCIN-SODIUM CHLORIDE 30-0.9 UT/500ML-% IV SOLN
2.5000 [IU]/h | INTRAVENOUS | Status: DC
  Administered 2021-07-28: 2.5 [IU]/h via INTRAVENOUS
  Filled 2021-07-28: qty 500

## 2021-07-28 MED ORDER — ONDANSETRON HCL 4 MG/2ML IJ SOLN: 4.0000 mg | INTRAMUSCULAR | Status: DC | PRN

## 2021-07-28 MED ORDER — ACETAMINOPHEN 325 MG PO TABS
650.0000 mg | ORAL_TABLET | ORAL | Status: DC | PRN
  Administered 2021-07-29: 650 mg via ORAL
  Filled 2021-07-28: qty 2

## 2021-07-28 MED ORDER — ONDANSETRON HCL 4 MG/2ML IJ SOLN
4.0000 mg | Freq: Four times a day (QID) | INTRAMUSCULAR | Status: DC | PRN
Start: 2021-07-28 — End: 2021-07-28

## 2021-07-28 MED ORDER — DOCUSATE SODIUM 100 MG PO CAPS
100.0000 mg | ORAL_CAPSULE | Freq: Two times a day (BID) | ORAL | Status: DC
  Administered 2021-07-29 – 2021-07-30 (×4): 100 mg via ORAL
  Filled 2021-07-28 (×4): qty 1

## 2021-07-28 MED ORDER — SIMETHICONE 80 MG PO CHEW: 80.0000 mg | CHEWABLE_TABLET | ORAL | Status: DC | PRN

## 2021-07-28 MED ORDER — OXYCODONE-ACETAMINOPHEN 5-325 MG PO TABS: 2.0000 | ORAL_TABLET | ORAL | Status: DC | PRN

## 2021-07-28 MED ORDER — LIDOCAINE HCL (PF) 1 % IJ SOLN
INTRAMUSCULAR | Status: DC | PRN
  Administered 2021-07-28: 2 mL via EPIDURAL
  Administered 2021-07-28: 10 mL via EPIDURAL

## 2021-07-28 MED ORDER — OXYTOCIN BOLUS FROM INFUSION
333.0000 mL | Freq: Once | INTRAVENOUS | Status: AC
  Administered 2021-07-28: 333 mL via INTRAVENOUS

## 2021-07-28 MED ORDER — LIDOCAINE HCL (PF) 1 % IJ SOLN: 30.0000 mL | INTRAMUSCULAR | Status: DC | PRN

## 2021-07-28 MED ORDER — WITCH HAZEL-GLYCERIN EX PADS: 1.0000 "application " | MEDICATED_PAD | CUTANEOUS | Status: DC | PRN

## 2021-07-28 MED ORDER — METHYLERGONOVINE MALEATE 0.2 MG/ML IJ SOLN: 0.2000 mg | INTRAMUSCULAR | Status: DC | PRN

## 2021-07-28 NOTE — H&P (Addendum)
OBSTETRIC ADMISSION HISTORY AND PHYSICAL Brandi Austin is a 31 y.o. female [phone] with IUP at [redacted]w[redacted]d by LMP presenting for spontaneous labor. A1DM and well controlled. She reports +FMs, No LOF, no VB, no blurry vision, headaches or peripheral edema, and RUQ pain.  She plans on breast and bottlefeeding. She request pills for birth control. She received her prenatal care at  [place]    Dating: By LMP --->  Estimated Date of Delivery: 08/01/21  Sono:   '@35w'$ , CWD, normal anatomy, cephalic presentation,  1478G, 93% EFW   Prenatal History/Complications:  Obesity [id number]  Past Medical History: Past Medical History:  Diagnosis Date   Chlamydia    Complete abortion 07/27/2011   Medical history non-contributory     Past Surgical History: Past Surgical History:  Procedure Laterality Date   left elbow surgery      Obstetrical History: OB History     Gravida  5   Para  3   Term  3   Preterm      AB  1   Living  3      SAB  1   IAB      Ectopic      Multiple  0   Live Births  3           Social History Social History   Socioeconomic History   Marital status: Single    Spouse name: Brandi Austin    Number of children: Not on file   Years of education: Not on file   Highest education level: Not on file  Occupational History   Occupation: homemaker  Tobacco Use   Smoking status: Never   Smokeless tobacco: Never  Vaping Use   Vaping Use: Former   Substances: Nicotine  Substance and Sexual Activity   Alcohol use: No    Comment: prior to pregnancy   Drug use: No   Sexual activity: Yes    Birth control/protection: None  Other Topics Concern   Not on file  Social History Narrative   Not on file   Social Determinants of Health   Financial Resource Strain: Not on file  Food Insecurity: No Food Insecurity   Worried About Running Out of Food in the Last Year: Never true   [place] in the Last Year: Never true  Transportation Needs: No  Transportation Needs   Lack of Transportation (Medical): No   Lack of Transportation (Non-Medical): No  Physical Activity: Not on file  Stress: Not on file  Social Connections: Not on file    Family History: Family History  Problem Relation Age of Onset   Healthy Father    Healthy Mother    Anesthesia problems Neg Hx     Allergies: Allergies  Allergen Reactions   Parsley Seed Rash    Medications Prior to Admission  Medication Sig Dispense Refill Last Dose   acetaminophen (TYLENOL) 325 MG tablet Take 2 tablets (650 mg total) by mouth every 6 (six) hours as needed (for pain scale < 4). 30 tablet 0 07/27/2021   Prenatal Vit-Fe Fumarate-FA (PRENATAL MULTIVITAMIN) TABS tablet Take 1 tablet by mouth daily at 12 noon.   07/27/2021   iron polysaccharides (NIFEREX) 150 MG capsule Take 1 capsule (150 mg total) by mouth daily. (Patient not taking: Reported on 05/10/2021) 30 capsule 3    ondansetron (ZOFRAN ODT) 4 MG disintegrating tablet Take 1 tablet (4 mg total) by mouth every 8 (eight) hours as needed for nausea or vomiting. (  Patient not taking: Reported on 06/16/2021) 20 tablet 0      Review of Systems   All systems reviewed and negative except as stated in HPI  Blood pressure 128/83, pulse 73, temperature 98.2 F (36.8 C), temperature source Oral, resp. rate 18, SpO2 100 %, unknown if currently breastfeeding. General appearance: alert, appears stated age, and no distress Lungs: clear to auscultation bilaterally Heart: regular rate and rhythm Abdomen: soft, non-tender; bowel sounds normal Pelvic: deferred Extremities: Homans sign is negative, no sign of DVT Presentation: cephalic Fetal monitoringBaseline: 140's bpm, Variability: Good {> 6 bpm), Accelerations: Reactive, and Decelerations: Absent Uterine activity: q 2 minutes, 30-60 seconds Dilation: 5.5 Effacement (%): 60 Station: -3 Exam by:: [employer] RN   Prenatal labs: ABO, Rh: --/--/O POS (02/16 1638) Antibody: NEG  (02/16 1638) Rubella: 10.40 (07/27 1033) RPR: Non Reactive (11/29 0846)  HBsAg: Negative (07/27 1033)  HIV: Non Reactive (11/29 0846)  GBS: Negative/-- (01/30 1003)  2 hr Glucola elevated Genetic screening  AFP negative, LR NIPS Anatomy US: Completed and appears normal  Prenatal Transfer Tool  Maternal Diabetes: Yes:  Diabetes Type:  Diet controlled Genetic Screening: Normal Maternal Ultrasounds/Referrals: Normal Fetal Ultrasounds or other Referrals:  None Maternal Substance Abuse:  No Significant Maternal Medications:  None Significant Maternal Lab Results: Group B Strep negative  Results for orders placed or performed during the hospital encounter of 07/28/21 (from the past 24 hour(s))  Type and screen   Collection Time: 07/28/21  4:38 PM  Result Value Ref Range   ABO/RH(D) PENDING    Antibody Screen PENDING    Sample Expiration      07/31/2021,2359 Performed at [facility], [address], [place], [place] [address]     Patient Active Problem List   Diagnosis Date Noted   Indication for care in labor or delivery 07/28/2021   Gestational diabetes 06/16/2021   Antepartum anemia 02/03/2021   Atypical squamous cell changes of undetermined significance (ASCUS) on cervical cytology with positive high risk human papilloma virus (HPV) 02/03/2021   Supervision of other normal pregnancy, antepartum 12/08/2020    Assessment/Plan:  Brandi Austin is a 31 y.o. [id number] at [redacted]w[redacted]d here for spontaneous labor, will continue with expectant management at this time, reassess after epidural placement, discussed AROM with patient.  #Pain: Plans for epidural #FWB: Cat 1 #ID:  GBS negative #MOF: Both #MOC:Pills #Circ:  Did not assess, will ask at next check.   Pt verbalized understanding and agrees with plan.   Brandi Austin, Student-MidWife  07/28/2021, 5:03 PM   I personally saw and evaluated the patient, performing the key elements of the service. I developed  and verified the management plan that is described in the resident's/student's note, and I agree with the content with my edits above. VSS, HRR&R, Resp unlabored, Legs neg.  Nigel Berthold, CNM 07/28/2021 8:55 PM

## 2021-07-28 NOTE — Patient Instructions (Signed)
Vaginal Delivery ?Vaginal delivery means that you give birth by pushing your baby out of your birth canal (vagina). Your health care team will help you before, during, and after vaginal delivery. ?Birth experiences are unique for every woman and every pregnancy, and birth experiences vary depending on where you choose to give birth. ?What are the risks and benefits? ?Generally, this is safe. However, problems may occur, including: ?Bleeding. ?Infection. ?Damage to other structures such as vaginal tearing. ?Allergic reactions to medicines. ?Despite the risks, benefits of vaginal delivery include less risk of bleeding and infection and a shorter recovery time compared to a Cesarean delivery. Cesarean delivery, or C-section, is the surgical delivery of a baby. ?What happens when I arrive at the birth center or hospital? ?Once you are in labor and have been admitted into the hospital or birth center, your health care team may: ?Review your pregnancy history and any concerns that you have. ?Talk with you about your birth plan and discuss pain control options. ?Check your blood pressure, breathing, and heartbeat. ?Assess your baby's heartbeat. ?Monitor your uterus for contractions. ?Check whether your bag of water (amniotic sac) has broken (ruptured). ?Insert an IV into one of your veins. This may be used to give you fluids and medicines. ?Monitoring ?Your health care team may assess your contractions (uterine monitoring) and your baby's heart rate (fetal monitoring). You may need to be monitored: ?Often, but not continuously (intermittently). ?All the time or for long periods at a time (continuously). Continuous monitoring may be needed if: ?You are taking certain medicines, such as medicine to relieve pain or make your contractions stronger. ?You have pregnancy or labor complications. ?Monitoring may be done by: ?Placing a special stethoscope or a handheld monitoring device on your abdomen to check your baby's heartbeat  and to check for contractions. ?Placing monitors on your abdomen (external monitors) to record your baby's heartbeat and the frequency and length of contractions. ?Placing monitors inside your uterus through your vagina (internal monitors) to record your baby's heartbeat and the frequency, length, and strength of your contractions. Depending on the type of monitor, it may remain in your uterus or on your baby's head until birth. ?Telemetry. This is a type of continuous monitoring that can be done with external or internal monitors. Instead of having to stay in bed, you are able to move around. ?Physical exam ?Your health care team may perform frequent physical exams. This may include: ?Checking how and where your baby is positioned in your uterus. ?Checking your cervix to determine: ?Whether it is thinning out (effacing). ?Whether it is opening up (dilating). ?What happens during labor and delivery? ?Normal labor and delivery is divided into the following three stages: ?Stage 1 ?This is the longest stage of labor. ?Throughout this stage, you will feel contractions. Contractions generally feel mild, infrequent, and irregular at first. They get stronger, more frequent, and more regular as you move through this stage. You may have contractions about every 2-3 minutes. ?This stage ends when your cervix is completely dilated to 4 inches (10 cm) and completely effaced. ?Stage 2 ?This stage starts once your cervix is completely effaced and dilated and lasts until the delivery of your baby. ?This is the stage where you will feel an urge to push your baby out of your vagina. ?You may feel stretching and burning pain, especially when the widest part of your baby's head passes through the vaginal opening (crowning). ?Once your baby is delivered, the umbilical cord will be clamped and   cut. Timing of cutting the cord will depend on your wishes, your baby's health, and your health care provider's practices. ?Your baby will be  placed on your bare chest (skin-to-skin contact) in an upright position and covered with a warm blanket. If you are choosing to breastfeed, watch your baby for feeding cues, like rooting or sucking, and help the baby to your breast for his or her first feeding. ?Stage 3 ?This stage starts immediately after the birth of your baby and ends after you deliver the placenta. ?This stage may take anywhere from 5 to 30 minutes. ?After your baby has been delivered, you will feel contractions as your body expels the placenta. These contractions also help your uterus get smaller and reduce bleeding. ?What can I expect after labor and delivery? ?After labor is over, you and your baby will be assessed closely until you are ready to go home. Your health care team will teach you how to care for yourself and your baby. ?You and your baby may be encouraged to stay in the same room (rooming in) during your hospital stay. This will help promote early bonding and successful breastfeeding. ?Your uterus will be checked and massaged regularly (fundal massage). ?You may continue to receive fluids and medicines through an IV. ?You will have some soreness and pain in your abdomen, vagina, and the area of skin between your vaginal opening and your anus (perineum). ?If an incision was made near your vagina (episiotomy) or if you had some vaginal tearing during delivery, cold compresses may be placed on your episiotomy or your tear. This helps to reduce pain and swelling. ?It is normal to have vaginal bleeding after delivery. Wear a sanitary pad for vaginal bleeding and discharge. ?Summary ?Vaginal delivery means that you will give birth by pushing your baby out of your birth canal (vagina). ?Your health care team will monitor you and your baby throughout the stages of labor. ?After you deliver your baby, your health care team will continue to assess you and your baby to ensure you are both recovering as expected after delivery. ?This  information is not intended to replace advice given to you by your health care provider. Make sure you discuss any questions you have with your health care provider. ?Document Revised: 04/26/2020 Document Reviewed: 04/26/2020 ?Elsevier Patient Education ? 2022 [employer] ? ?

## 2021-07-28 NOTE — Anesthesia Procedure Notes (Signed)
Epidural Patient location during procedure: OB Start time: 07/28/2021 5:44 PM End time: 07/28/2021 5:57 PM  Staffing Anesthesiologist: Lannie Fields, DO Performed: anesthesiologist   Preanesthetic Checklist Completed: patient identified, IV checked, risks and benefits discussed, monitors and equipment checked, pre-op evaluation and timeout performed  Epidural Patient position: sitting Prep: DuraPrep and site prepped and draped Patient monitoring: continuous pulse ox, blood pressure, heart rate and cardiac monitor Approach: midline Location: L3-L4 Injection technique: LOR air  Needle:  Needle type: Tuohy  Needle gauge: 17 G Needle length: 9 cm Needle insertion depth: 7 cm Catheter type: closed end flexible Catheter size: 19 Gauge Catheter at skin depth: 12 cm Test dose: negative  Assessment Sensory level: T8 Events: blood not aspirated, injection not painful, no injection resistance, no paresthesia and negative IV test  Additional Notes Patient identified. Risks/Benefits/Options discussed with patient including but not limited to bleeding, infection, nerve damage, paralysis, failed block, incomplete pain control, headache, blood pressure changes, nausea, vomiting, reactions to medication both or allergic, itching and postpartum back pain. Confirmed with bedside nurse the patient's most recent platelet count. Confirmed with patient that they are not currently taking any anticoagulation, have any bleeding history or any family history of bleeding disorders. Patient expressed understanding and wished to proceed. All questions were answered. Sterile technique was used throughout the entire procedure. Please see nursing notes for vital signs. Test dose was given through epidural catheter and negative prior to continuing to dose epidural or start infusion. Warning signs of high block given to the patient including shortness of breath, tingling/numbness in hands, complete motor  block, or any concerning symptoms with instructions to call for help. Patient was given instructions on fall risk and not to get out of bed. All questions and concerns addressed with instructions to call with any issues or inadequate analgesia.  Reason for block:procedure for pain

## 2021-07-28 NOTE — Lactation Note (Signed)
This note was copied from a baby's chart. Lactation Consultation Note Mom's feeding choices are BF/formula. Experienced BF mom declines the need for [facility] services.  Patient Name: Boy Sadia Belfiore [id number] Date: 07/28/2021   Age:29 hours  Maternal Data    Feeding    LATCH Score                    Lactation Tools Discussed/Used    Interventions    Discharge    Consult Status Consult Status: Complete    Charyl Dancer 07/28/2021, 9:41 PM

## 2021-07-28 NOTE — MAU Note (Signed)
.  Brandi Austin is a 31 y.o. at [redacted]w[redacted]d here in MAU reporting: ctx that are every 3 minutes apart. Reports some bloody show. Denies LOF. Endorses good fetal movement. Was 3.5 cm in the office earlier today.   Pain score: 6

## 2021-07-28 NOTE — Progress Notes (Signed)
Subjective:  Brandi Austin is a 31 y.o. [phone] at [redacted]w[redacted]d being seen today for ongoing prenatal care.  She is currently monitored for the following issues for this high-risk pregnancy and has Supervision of other normal pregnancy, antepartum; Antepartum anemia; Atypical squamous cell changes of undetermined significance (ASCUS) on cervical cytology with positive high risk human papilloma virus (HPV); and Gestational diabetes on their problem list.  Patient reports general discomforts of pregnancy.  Contractions: Irritability. Vag. Bleeding: None.  Movement: Present. Denies leaking of fluid.   The following portions of the patient's history were reviewed and updated as appropriate: allergies, current medications, past family history, past medical history, past social history, past surgical history and problem list. Problem list updated.  Objective:   Vitals:   07/28/21 1058  BP: 116/70  Pulse: 71  Weight: 196 lb (88.9 kg)    Fetal Status: Fetal Heart Rate (bpm): 143   Movement: Present     General:  Alert, oriented and cooperative. Patient is in no acute distress.  Skin: Skin is warm and dry. No rash noted.   Cardiovascular: Normal heart rate noted  Respiratory: Normal respiratory effort, no problems with respiration noted  Abdomen: Soft, gravid, appropriate for gestational age. Pain/Pressure: Present     Pelvic:  Cervical exam performed        Extremities: Normal range of motion.  Edema: Trace  Mental Status: Normal mood and affect. Normal behavior. Normal judgment and thought content.   Urinalysis:      Assessment and Plan:  Pregnancy: [id number] at [redacted]w[redacted]d  1. Supervision of other normal pregnancy, antepartum Labor precautions  IOL scheduled  2. Diet controlled gestational diabetes mellitus (GDM) in third trimester CBG's in goal range  93 % growth on 06/29/21  3. Atypical squamous cell changes of undetermined significance (ASCUS) on cervical cytology with  positive high risk human papilloma virus (HPV) Repeat pap smear PP  Term labor symptoms and general obstetric precautions including but not limited to vaginal bleeding, contractions, leaking of fluid and fetal movement were reviewed in detail with the patient. Please refer to After Visit Summary for other counseling recommendations.  No follow-ups on file.   Chancy Milroy, MD

## 2021-07-28 NOTE — Progress Notes (Signed)
OB/GYN Faculty Practice: Labor Progress Note  Subjective: Pt reports she is doing well, occasionally feels mild contraction. Pain level 1 at this time.  She agrees to AROM.  Objective: Pt is s/p epidural placement and accompanied by her partner.  BP (!) 151/78    Pulse 78    Temp 98.2 F (36.8 C) (Oral)    Resp 18    SpO2 100%  Gen: Resting in bed, breathing through contractions.  Pelvic: AROM, small amount of clear fluid  mixed with mucous. Dilation: 5.5 Effacement (%): 60 Cervical Position: Middle Station: -2 Presentation: Vertex Exam by:: Desiraye Rolfson,SNM  Assessment and Plan: 31 y.o. [id number] [redacted]w[redacted]d admitted for spontaneous labor.  Labor: Latent labor, no cervical change, fetus head well applied to cervix, AROM.  Continue expectant management. Will reassess in 4 hours PRN, consider low dose pitocin if needed. Fetal Well-Being: Category Cat 1  Pain control: Epidural and pt tolerating contractions.   Kandis Fantasia, SNM, [facility]

## 2021-07-28 NOTE — Anesthesia Preprocedure Evaluation (Signed)
Anesthesia Evaluation  Patient identified by MRN, date of birth, ID band Patient awake    Reviewed: Allergy & Precautions, Patient's Chart, lab work & pertinent test results  Airway Mallampati: II  TM Distance: >3 FB Neck ROM: Full    Dental no notable dental hx.    Pulmonary neg pulmonary ROS,    Pulmonary exam normal breath sounds clear to auscultation       Cardiovascular negative cardio ROS Normal cardiovascular exam Rhythm:Regular Rate:Normal     Neuro/Psych negative neurological ROS  negative psych ROS   GI/Hepatic negative GI ROS, Neg liver ROS,   Endo/Other  diabetes, Well Controlled, GestationalMorbid obesityBMI 40  Renal/GU negative Renal ROS  negative genitourinary   Musculoskeletal negative musculoskeletal ROS (+)   Abdominal (+) + obese,   Peds  Hematology  (+) Blood dyscrasia, anemia , Hb 9.7, plt 208   Anesthesia Other Findings   Reproductive/Obstetrics (+) Pregnancy                             Anesthesia Physical Anesthesia Plan  ASA: 3  Anesthesia Plan: Epidural   Post-op Pain Management:    Induction:   PONV Risk Score and Plan: 2  Airway Management Planned: Natural Airway  Additional Equipment: None  Intra-op Plan:   Post-operative Plan:   Informed Consent: I have reviewed the patients History and Physical, chart, labs and discussed the procedure including the risks, benefits and alternatives for the proposed anesthesia with the patient or authorized representative who has indicated his/her understanding and acceptance.       Plan Discussed with:   Anesthesia Plan Comments:         Anesthesia Quick Evaluation

## 2021-07-28 NOTE — Discharge Summary (Signed)
Postpartum Discharge Summary     Patient Name: Brandi Austin DOB: 02-23-1991 MRN: [id number]  Date of admission: 07/28/2021 Delivery date:07/28/2021  Delivering provider: Christin Fudge  Date of discharge: 07/30/2021  Admitting diagnosis: Indication for care in labor or delivery [O75.9] Intrauterine pregnancy: [redacted]w[redacted]d     Secondary diagnosis:  Principal Problem:   Indication for care in labor or delivery  Additional problems: none    Discharge diagnosis: Term Pregnancy Delivered                                              Post partum procedures: none Augmentation: AROM Complications: None  Hospital course: Onset of Labor With Vaginal Delivery      31 y.o. yo [id number] at 108w3d was admitted in Active Labor on 07/28/2021. Patient had an uncomplicated labor course as follows:  Membrane Rupture Time/Date: 6:52 PM ,07/28/2021   Delivery Method:Vaginal, Spontaneous  Episiotomy: None  Lacerations:  2nd degree;Vaginal  Patient had an uncomplicated postpartum course.  She is ambulating, tolerating a regular diet, passing flatus, and urinating well. Patient is discharged home in stable condition on 07/30/21.  Newborn Data: Birth date:07/28/2021  Birth time:9:07 PM  Gender:Female  Living status:Living  Apgars:8 ,9  Weight:3890 g   Magnesium Sulfate received: No BMZ received: No Rhophylac:N/A MMR:N/A T-DaP:Given prenatally Flu: Yes Transfusion:No  Physical exam  Vitals:   07/29/21 0858 07/29/21 1440 07/29/21 2115 07/30/21 0500  BP: 116/70 118/72 119/67 (!) 116/48  Pulse: 70 77 78 75  Resp: $Remo'18 19 16 16  'tcEqo$ Temp: 98.7 F (37.1 C)   98.5 F (36.9 C)  TempSrc: Oral     SpO2:    100%   General: alert, cooperative, and no distress Lochia: appropriate Uterine Fundus: firm, U-1 Incision: N/A DVT Evaluation: No evidence of DVT seen on physical exam. Negative Homan's sign. No cords or calf tenderness. No significant calf/ankle edema. Labs: Lab Results   Component Value Date   WBC 10.1 07/28/2021   HGB 9.7 (L) 07/28/2021   HCT 32.0 (L) 07/28/2021   MCV 73.1 (L) 07/28/2021   PLT 208 07/28/2021   CMP Latest Ref Rng & Units 07/30/2021  Glucose 70 - 99 mg/dL 98  BUN 6 - 20 mg/dL -  Creatinine 0.44 - 1.00 mg/dL -  Sodium 135 - 145 mmol/L -  Potassium 3.5 - 5.1 mmol/L -  Chloride 98 - 111 mmol/L -  CO2 22 - 32 mmol/L -  Calcium 8.9 - 10.3 mg/dL -  Total Protein 6.5 - 8.1 g/dL -  Total Bilirubin 0.3 - 1.2 mg/dL -  Alkaline Phos 38 - 126 U/L -  AST 15 - 41 U/L -  ALT 0 - 44 U/L -   Edinburgh Score: Edinburgh Postnatal Depression Scale Screening Tool 07/28/2021  I have been able to laugh and see the funny side of things. 0  I have looked forward with enjoyment to things. 0  I have blamed myself unnecessarily when things went wrong. 0  I have been anxious or worried for no good reason. 0  I have felt scared or panicky for no good reason. 0  Things have been getting on top of me. 1  I have been so unhappy that I have had difficulty sleeping. 1  I have felt sad or miserable. 0  I have been so unhappy that I have  been crying. 0  The thought of harming myself has occurred to me. 0  Edinburgh Postnatal Depression Scale Total 2     After visit meds:  Allergies as of 07/30/2021       Reactions   Parsley Seed Rash        Medication List     STOP taking these medications    acetaminophen 325 MG tablet Commonly known as: Tylenol   iron polysaccharides 150 MG capsule Commonly known as: NIFEREX   ondansetron 4 MG disintegrating tablet Commonly known as: Zofran ODT       TAKE these medications    ferrous sulfate 325 (65 FE) MG tablet Take 1 tablet (325 mg total) by mouth every other day. Start taking on: July 31, 2021   ibuprofen 600 MG tablet Commonly known as: ADVIL Take 1 tablet (600 mg total) by mouth every 6 (six) hours.   prenatal multivitamin Tabs tablet Take 1 tablet by mouth daily at 12 noon.          Discharge home in stable condition Infant Feeding: Bottle and Breast Infant Disposition:home with mother Discharge instruction: per After Visit Summary and Postpartum booklet. Activity: Advance as tolerated. Pelvic rest for 6 weeks.  Diet: routine diet Future Appointments: Future Appointments  Date Time Provider [place]  08/26/2021  8:35 AM Anyanwu, Sallyanne Havers, MD [facility]  08/26/2021  9:30 AM [facility] [place]   Follow up Visit:  [place] [facility]. Go on 08/26/2021.   Specialty: Obstetrics and Gynecology Why: postpartum visit, As scheduled Contact information: [place] [id number] [phone]                 Please schedule this patient for a In person postpartum visit in 4 weeks with the following provider: Any provider. Additional Postpartum F/U:  Low risk pregnancy complicated by:  Delivery mode:  Vaginal, Spontaneous  Anticipated Birth Control:  POPs   07/30/2021 Laury Deep, CNM

## 2021-07-29 ENCOUNTER — Inpatient Hospital Stay (HOSPITAL_COMMUNITY): Payer: Self-pay

## 2021-07-29 ENCOUNTER — Inpatient Hospital Stay (HOSPITAL_COMMUNITY): Admission: AD | Admit: 2021-07-29 | Payer: Self-pay | Source: Home / Self Care | Admitting: Obstetrics and Gynecology

## 2021-07-29 LAB — RPR: RPR Ser Ql: NONREACTIVE

## 2021-07-29 NOTE — Progress Notes (Signed)
POSTPARTUM PROGRESS NOTE  Post Partum Day 1  Subjective:  Brandi Austin Brandi Austin is a 31 y.o. [id number] s/p SVD at [redacted]w[redacted]d.  She reports she is doing well. No acute events overnight. She denies any problems with ambulating, voiding or po intake. Denies nausea or vomiting.  Pain is well controlled.  Lochia is mild.  Objective: Blood pressure 116/70, pulse 70, temperature 98.7 F (37.1 C), temperature source Oral, resp. rate 18, SpO2 95 %, unknown if currently breastfeeding.  Physical Exam:  General: alert, cooperative and no distress Chest: no respiratory distress Heart:regular rate, distal pulses intact Uterine Fundus: firm DVT Evaluation: No calf swelling or tenderness Skin: warm, dry  Recent Labs    07/28/21 1637  HGB 9.7*  HCT 32.0*    Assessment/Plan: Brandi Austin Brandi Austin is a 31 y.o. [id number] s/p SVD at [redacted]w[redacted]d   PPD#1 - Doing well  Routine postpartum care  Iron def anemia: Hgb 9.7. Asymptomatic. Start po iron every other day.   Contraception: Unsure, considering POPs Feeding: breast and formula  Dispo: Plan for discharge tomorrow.   LOS: 1 day   Leticia Penna, DO  OB Fellow  07/29/2021, 2:25 PM

## 2021-07-29 NOTE — Lactation Note (Signed)
This note was copied from a baby's chart. Lactation Consultation Note  Patient Name: Brandi Austin [id number] Date: 07/29/2021 Reason for consult: Initial assessment;Mother's request;Difficult latch;Term;Breastfeeding assistance;Maternal endocrine disorder Age:29 hours  LC assisted getting a latch following suck training and 4 ml of colostrum on the spoon. Infant still latched at the end of the visit with signs of milk transfer.  Mom feeding plan breast and bottle with EBM/ formula.   Infant last feeding prior to [facility] visit 01:00 am  Plan 1 To feed based on cues 8-12x 24hr period.  2. Mom to offer breasts and look for signs of milk transfer.  3. Mom to supplement with EBM first followed by formula with 7-12 ml per feeding. If infant not latched she can offer more.  4. I and O sheet reviewed.  5. Pumping with manual q 3hrs for 10 min each breast.   All questions answered at the end of the visit.   Maternal Data Has patient been taught Hand Expression?: Yes Does the patient have breastfeeding experience prior to this delivery?: Yes How long did the patient breastfeed?: 7 months  Feeding Mother's Current Feeding Choice: Breast Milk and Formula  LATCH Score Latch: Repeated attempts needed to sustain latch, nipple held in mouth throughout feeding, stimulation needed to elicit sucking reflex.  Audible Swallowing: Spontaneous and intermittent  Type of Nipple: Everted at rest and after stimulation  Comfort (Breast/Nipple): Soft / non-tender  Hold (Positioning): Assistance needed to correctly position infant at breast and maintain latch.  LATCH Score: 8   Lactation Tools Discussed/Used Tools: Pump;Flanges Flange Size: 24 Breast pump type: Manual Pump Education: Setup, frequency, and cleaning;Milk Storage Reason for Pumping: increase stimulation Pumping frequency: every 3 hrs for 10 min each breast  Interventions Interventions: Breast feeding basics  reviewed;Assisted with latch;Skin to skin;Breast massage;Hand express;Breast compression;Adjust position;Support pillows;Position options;Expressed milk;Hand pump;Education;Pace feeding;LC Psychologist, educational;Infant Driven Feeding Algorithm education  Discharge Pump: Manual WIC Program: Yes  Consult Status Consult Status: Follow-up Date: 07/30/21 Follow-up type: In-patient    Eda Magnussen  Nicholson-Springer 07/29/2021, 12:05 PM

## 2021-07-29 NOTE — Anesthesia Postprocedure Evaluation (Signed)
Anesthesia Post Note  Patient: Brandi Austin  Procedure(s) Performed: AN AD [place]     Patient location during evaluation: Mother Baby Anesthesia Type: Epidural Level of consciousness: awake and alert Pain management: pain level controlled Vital Signs Assessment: post-procedure vital signs reviewed and stable Respiratory status: spontaneous breathing, nonlabored ventilation and respiratory function stable Cardiovascular status: stable Postop Assessment: no headache, no backache, epidural receding, no apparent nausea or vomiting, patient able to bend at knees, adequate PO intake and able to ambulate Anesthetic complications: no   No notable events documented.  Last Vitals:  Vitals:   07/29/21 0015 07/29/21 0450  BP: 114/66 (!) 104/55  Pulse: 93 70  Resp: 18 18  Temp: 36.9 C 37.1 C  SpO2: 99% 95%    Last Pain:  Vitals:   07/29/21 0450  TempSrc: Oral  PainSc: 3    Pain Goal: Patients Stated Pain Goal: 4 (07/28/21 2315)                 Jabier Mutton

## 2021-07-30 ENCOUNTER — Encounter (HOSPITAL_COMMUNITY): Payer: Self-pay | Admitting: Obstetrics and Gynecology

## 2021-07-30 LAB — GLUCOSE, RANDOM: Glucose, Bld: 98 mg/dL (ref 70–99)

## 2021-07-30 MED ORDER — IBUPROFEN 600 MG PO TABS
600.0000 mg | ORAL_TABLET | Freq: Four times a day (QID) | ORAL | 0 refills | Status: DC
Start: 2021-07-30 — End: 2021-08-26

## 2021-07-30 MED ORDER — FERROUS SULFATE 325 (65 FE) MG PO TABS: 325.0000 mg | ORAL_TABLET | ORAL | 3 refills | Status: DC

## 2021-07-30 NOTE — Lactation Note (Signed)
This note was copied from a baby's chart. Lactation Consultation Note  Patient Name: Brandi Austin [id number] Date: 07/30/2021 Reason for consult: Follow-up assessment;Term;Infant weight loss;Other (Comment) (mom , baby and dad ready for D/C . per mom baby last fed at 8:30 - formula. LC reviewed the doc flow sheets - already updated. LC reviewed BF D/C teaching and encouraged mom to give the baby plenty of practice at the breast to establish milk supply.) Age:52 hours  Maternal Data    Feeding Mother's Current Feeding Choice: Breast Milk and Formula Nipple Type: Slow - flow  LATCH Score                    Lactation Tools Discussed/Used Tools: Pump Flange Size: 24 Breast pump type: Manual Pump Education: Milk Storage  Interventions Interventions: Breast feeding basics reviewed;Education  Discharge Discharge Education: Engorgement and breast care;Warning signs for feeding baby  Consult Status Consult Status: Complete Date: 07/30/21    Jerlyn Ly [facility] 07/30/2021, 11:12 AM

## 2021-07-31 ENCOUNTER — Inpatient Hospital Stay (HOSPITAL_COMMUNITY): Payer: Self-pay

## 2021-08-01 LAB — SURGICAL PATHOLOGY

## 2021-08-09 ENCOUNTER — Telehealth (HOSPITAL_COMMUNITY): Payer: Self-pay

## 2021-08-09 NOTE — Telephone Encounter (Signed)
"  Doing great. Everything has been pretty good." Patient has no questions or concerns about her healing.  "He sleeps good. Eating well and gaining weight. He sleeps in a bassinet."  RN reviewed ABC's of safe sleep with patient. Patient declines any questions or concerns about baby.  EPDS score is 0.  Marcelino Duster [facility] 08/09/2021,1511

## 2021-08-26 ENCOUNTER — Encounter: Payer: Self-pay | Admitting: Obstetrics & Gynecology

## 2021-08-26 ENCOUNTER — Other Ambulatory Visit: Payer: Self-pay

## 2021-08-26 ENCOUNTER — Other Ambulatory Visit (HOSPITAL_COMMUNITY)
Admission: RE | Admit: 2021-08-26 | Discharge: 2021-08-26 | Disposition: A | Discharge status: Home / Self Care | Payer: Self-pay | Source: Ambulatory Visit | Attending: Obstetrics & Gynecology | Admitting: Obstetrics & Gynecology

## 2021-08-26 ENCOUNTER — Other Ambulatory Visit: Payer: Self-pay | Admitting: General Practice

## 2021-08-26 ENCOUNTER — Ambulatory Visit (INDEPENDENT_AMBULATORY_CARE_PROVIDER_SITE_OTHER): Payer: Self-pay | Admitting: Obstetrics & Gynecology

## 2021-08-26 VITALS — BP 114/76 | HR 87 | Ht <= 58 in | Wt 180.0 lb

## 2021-08-26 DIAGNOSIS — R87810 Cervical high risk human papillomavirus (HPV) DNA test positive: Secondary | ICD-10-CM

## 2021-08-26 DIAGNOSIS — R87610 Atypical squamous cells of undetermined significance on cytologic smear of cervix (ASC-US): Secondary | ICD-10-CM

## 2021-08-26 DIAGNOSIS — Z8632 Personal history of gestational diabetes: Secondary | ICD-10-CM

## 2021-08-26 NOTE — Progress Notes (Signed)
? ? ?Post Partum Visit Note ? ?Brandi Austin is a 31 y.o. [id number] female who presents for a postpartum visit. She is 4 weeks postpartum following a normal spontaneous vaginal delivery.  I have fully reviewed the prenatal and intrapartum course, had A1GDM. The delivery was at 39.3 gestational weeks.  Anesthesia: epidural. Postpartum course has been uncomplicated. Baby is doing well. Baby is feeding by both breast and bottle - Gerber Soothe Pro . Bleeding staining only. Bowel function is normal. Bladder function is normal. Patient is not sexually active. Contraception method is none. Postpartum depression screening: negative. ? ? ?The pregnancy intention screening data noted above was reviewed. Potential methods of contraception were discussed. The patient declined birth control. ? ? Edinburgh Postnatal Depression Scale - 08/26/21 0849   ? ?  ? Edinburgh Postnatal Depression Scale:  In the Past 7 Days  ? I have been able to laugh and see the funny side of things. 0   ? I have looked forward with enjoyment to things. 0   ? I have blamed myself unnecessarily when things went wrong. 0   ? I have been anxious or worried for no good reason. 0   ? I have felt scared or panicky for no good reason. 0   ? Things have been getting on top of me. 0   ? I have been so unhappy that I have had difficulty sleeping. 0   ? I have felt sad or miserable. 0   ? I have been so unhappy that I have been crying. 0   ? The thought of harming myself has occurred to me. 0   ? Edinburgh Postnatal Depression Scale Total 0   ? ?  ?  ? ?  ? ? ?Health Maintenance Due  ?Topic Date Due  ? COVID-19 Vaccine (1) Never done  ? PAP-Cervical Cytology Screening  Never done  ? PAP SMEAR-Modifier  Never done  ? ? ?The following portions of the patient's history were reviewed and updated as appropriate: allergies, current medications, past family history, past medical history, past social history, past surgical history, and problem  list. ? ?Review of Systems ?Pertinent items noted in HPI and remainder of comprehensive ROS otherwise negative. ? ?Objective:  ?BP 114/76   Pulse 87   Ht 4\' 10"  (1.473 m)   Wt 180 lb (81.6 kg)   Breastfeeding Yes   BMI 37.62 kg/m?   ? ?General:  alert and no distress  ? Breasts:  normal, done with chaperone present  ?Lungs: clear to auscultation bilaterally  ?Heart:  regular rate and rhythm  ?Abdomen: soft, non-tender; bowel sounds normal; no masses,  no organomegaly   ?GU exam:  normal, well healed laceration, pap done, done in presence of chaperone  ?     ?Assessment:  ? ?Normal postpartum exam.  ? ?Plan:  ? ?Essential components of care per ACOG recommendations: ? ?1.  Mood and well being: Patient with negative depression screening today. Reviewed local resources for support.  ?- Patient tobacco use? No.   ?- hx of drug use? No.   ? ?2. Infant care and feeding:  ?-Patient currently breastmilk feeding? Yes. Reviewed importance of draining breast regularly to support lactation.  ?-Social determinants of health (SDOH) reviewed in EPIC. No concerns. ? ?3. Sexuality, contraception and birth spacing ?- Patient does want a pregnancy in the next year.  Desired family size is 5 children.  ?- Reviewed reproductive life planning. Reviewed contraceptive methods based on pt preferences  and effectiveness.  Patient declined birth control, had some difficulty conceiving last pregnancy.   ?- Discussed birth spacing of 18 months, but recommended continuing PNV for now. ? ?4. Sleep and fatigue ?-Encouraged family/partner/community support of 4 hrs of uninterrupted sleep to help with mood and fatigue ? ?5. Physical Recovery  ?- Discussed patients delivery and complications. She describes her labor as good. ?- Patient had a Vaginal, no problems at delivery. Patient had a 2nd degree laceration. Perineal healing reviewed. Patient expressed understanding ?- Patient has urinary incontinence? No. ?- Patient is not safe to resume  physical and sexual activity for at two more weeks. ? ?6.  Health Maintenance ?- HM due items addressed Yes ?- Last pap smear  ASCUS +HRHPV in 2020. Pap smear done at today's visit.  ?-Breast Cancer screening indicated? No.  ? ?7. Chronic Disease/Pregnancy Condition follow up: Gestational Diabetes ?- Patient to be scheduled for 2 hr GTT soon ?- PCP follow up as needed ? ? ?Verita Schneiders, MD ?Center for [employer], [facility] [facility]  ?

## 2021-08-29 ENCOUNTER — Other Ambulatory Visit: Payer: Self-pay

## 2021-08-30 ENCOUNTER — Encounter: Payer: Self-pay | Admitting: Obstetrics & Gynecology

## 2021-08-30 LAB — CYTOLOGY - PAP
Comment: NEGATIVE
Diagnosis: UNDETERMINED — AB
High risk HPV: NEGATIVE

## 2021-09-06 ENCOUNTER — Other Ambulatory Visit: Payer: Self-pay

## 2021-10-20 ENCOUNTER — Other Ambulatory Visit: Payer: Self-pay

## 2021-10-20 DIAGNOSIS — Z8632 Personal history of gestational diabetes: Secondary | ICD-10-CM

## 2021-10-21 ENCOUNTER — Encounter: Payer: Self-pay | Admitting: Obstetrics & Gynecology

## 2021-10-21 LAB — GLUCOSE TOLERANCE, 2 HOURS
Glucose, 2 hour: 91 mg/dL (ref 70–139)
Glucose, GTT - Fasting: 88 mg/dL (ref 70–99)

## 2022-03-21 NOTE — Progress Notes (Signed)
 HPI:   31 y.o. [id number] female, No LMP recorded. Patient has had an implant., who is here for colposcopy for LSIL. 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 copper intrauterine device (PARAGARD T 380A) 380 square mm 1 Application by Intrauterine route once.     No current Epic-ordered facility-administered medications on file.    Allergies  Allergen Reactions  . Parsley Seed Rash (ALLERGY/intolerance)    Past Medical History:  Diagnosis Date  . Abnormal Pap smear of cervix     @   Family History  Problem Relation Age of Onset  . Diabetes Mother     Social History   Socioeconomic History  . Marital status: Unknown    Spouse name: None  . Number of children: None  . Years of education: None  . Highest education level: None  Occupational History  . None  Tobacco Use  . Smoking status: Never  . Smokeless tobacco: Never  Vaping Use  . Vaping Use: Former  Substance and Sexual Activity  . Alcohol use: Yes  . Drug use: Not Currently  . Sexual activity: Yes    Birth control/protection: I.U.D.  Other Topics Concern  . None  Social History Narrative  . None   Social Determinants of Health   Financial Resource Strain: Not on file  Food Insecurity: Not on file  Transportation Needs: Not on file  Physical Activity: Not on file  Stress: Not on file  Social Connections: Not on file  Housing Stability: Not on file    Vitals:   03/21/22 1636  BP: 111/71  Pulse: 79     EXAM:  General: External Genitalia:  Normal Vagina:  well estrogenized and physiologic discharge Cervix:  parous and IUD strings visualize   COLPOSCOPY PROCEDURE NOTE  Urine HCG: negative.    The procedure was explained to the patient and her  allergies verified.  The cervix and upper vagina were washed with vinegar. Colposcopy of the cervix and upper vagina was carried out with findings as diagrammed below.  Colposcopy was satisfactory.  Findings:  Aceto white change 6:00, vascularity 12:00 ECC was not done.  Biopsies were taken at 6, 12        IMPRESSION/PLAN: 1)  Colposcopic impression:   Satisfactory.  Specimens labelled and sent to Pathology.  We will base further treatment on pathology findings.  Post biopsy instructions are given to her.  We will notify her by phone, letter or email of results and follow up plan in approximately 2 weeks.   2)   Gardasil vaccine:  Patient unsure if she has had it. I encouraged her to ask her parents and if she has not had it to reach out to HD to obtain vaccination. Discussed benefits of vaccination.  Orders Placed This Encounter  Procedures  . POCT Pregnancy, Urine     Requested Prescriptions    No prescriptions requested or ordered in this encounter       Electronically signed by: Jolane Lavanda Ee, MD 03/21/22 1700

## 2023-02-23 IMAGING — US US MFM OB DETAIL+14 WK
1 series · 13 of 28 positions shown · non-contrast
Comparison: none

[Series 1: us mfm ob detail+14 wk · 147 acquisitions, 13 frames shown]
[im 6/147]
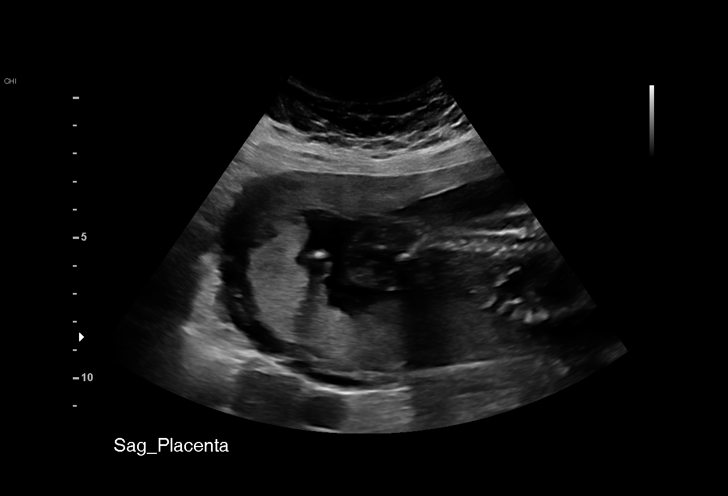
[im 17/147]
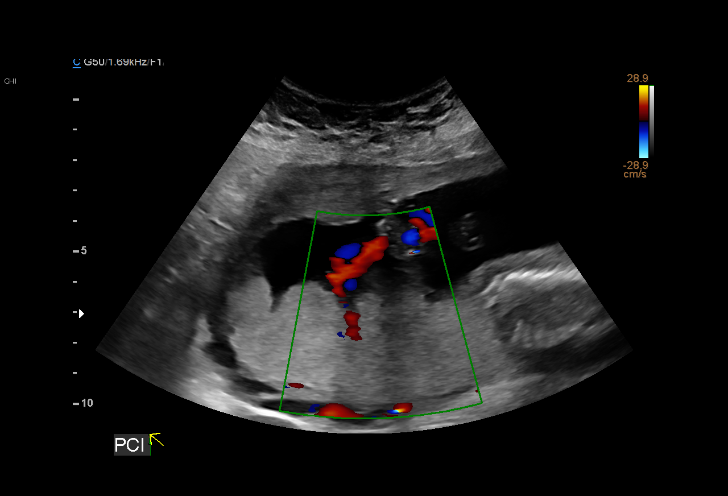
[im 28/147]
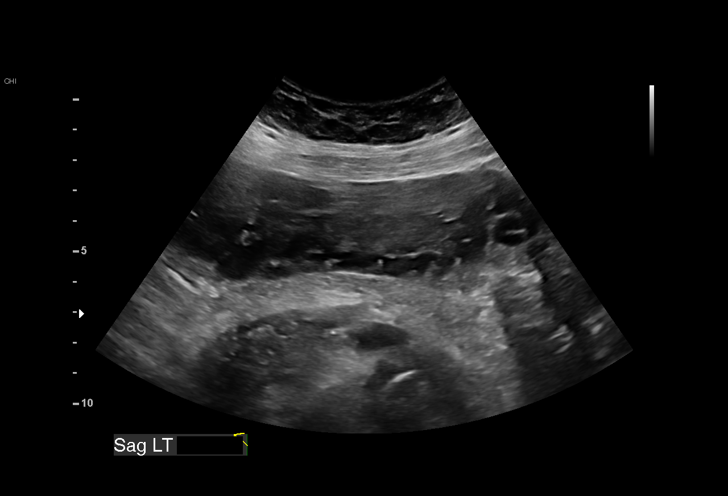
[im 38/147]
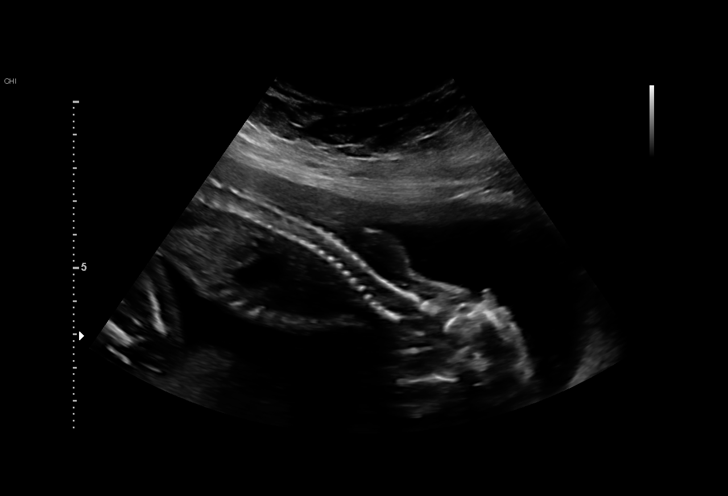
[im 49/147]
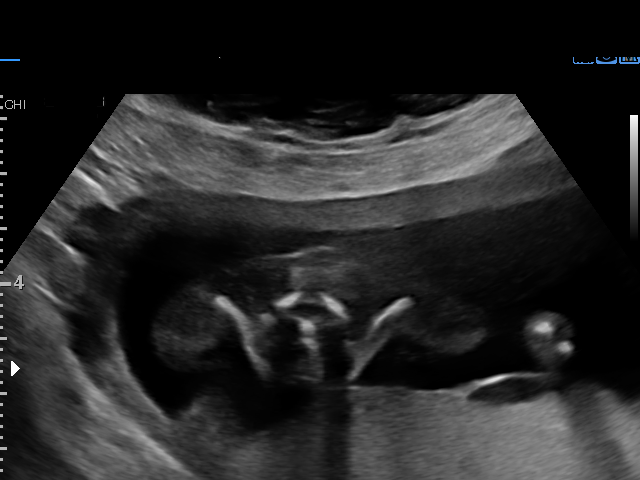
[im 60/147]
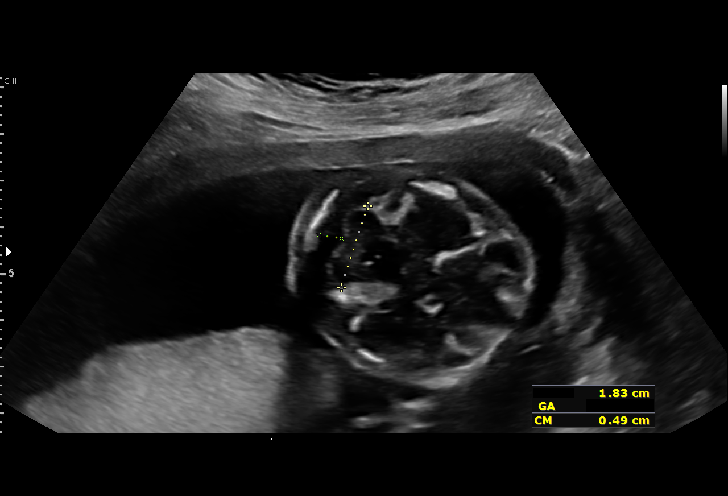
[im 76/147]
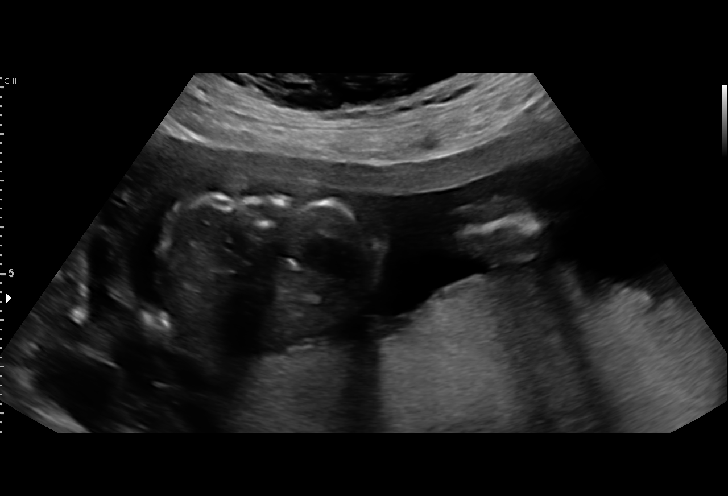
[im 87/147]
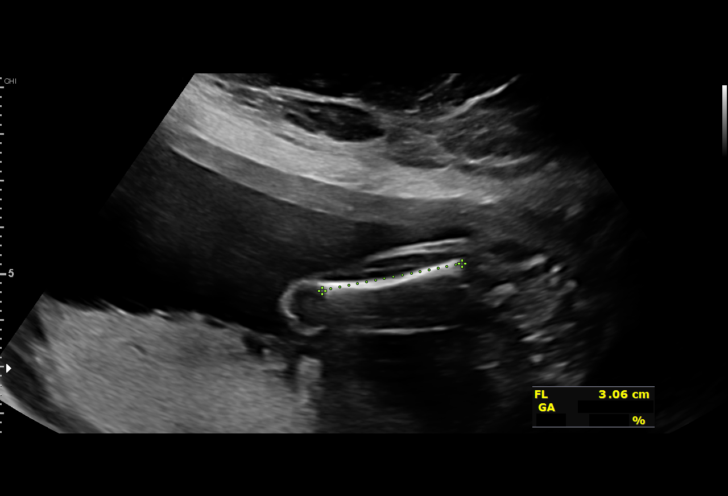
[im 98/147]
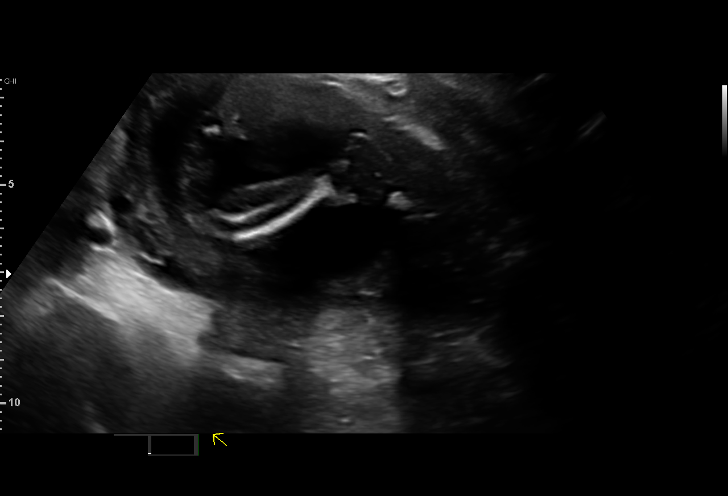
[im 109/147]
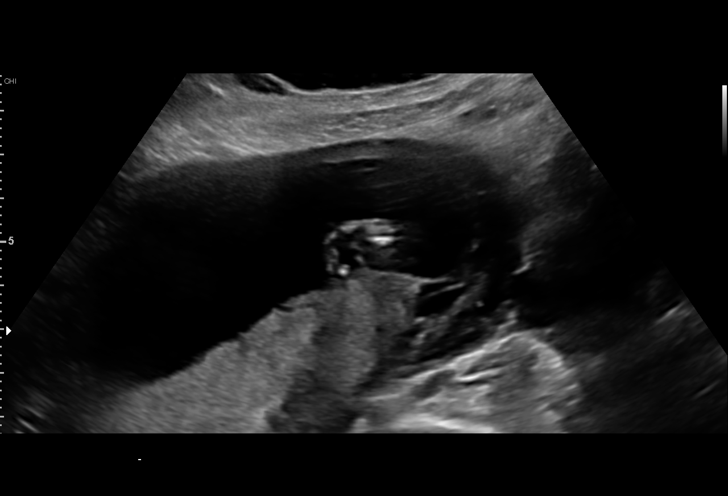
[im 119/147]
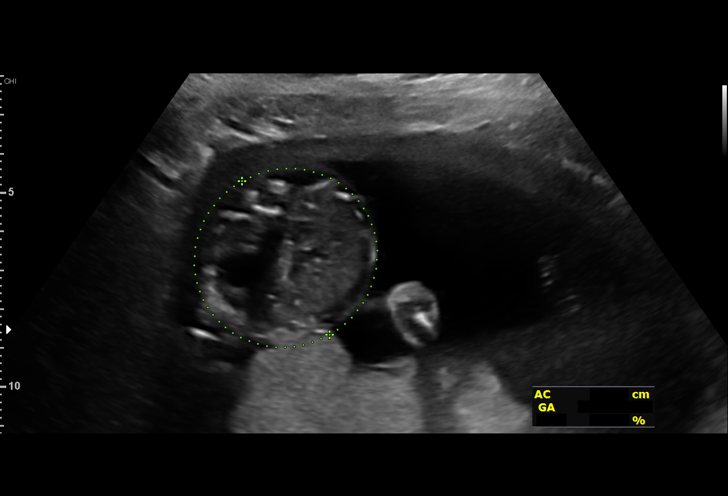
[im 130/147]
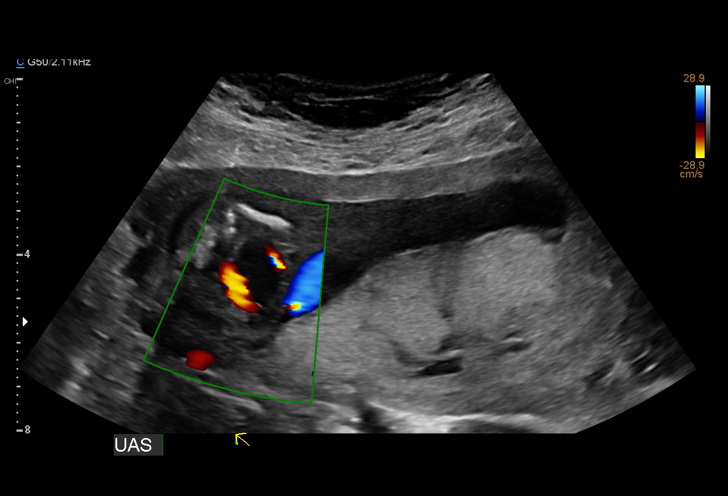
[im 141/147]
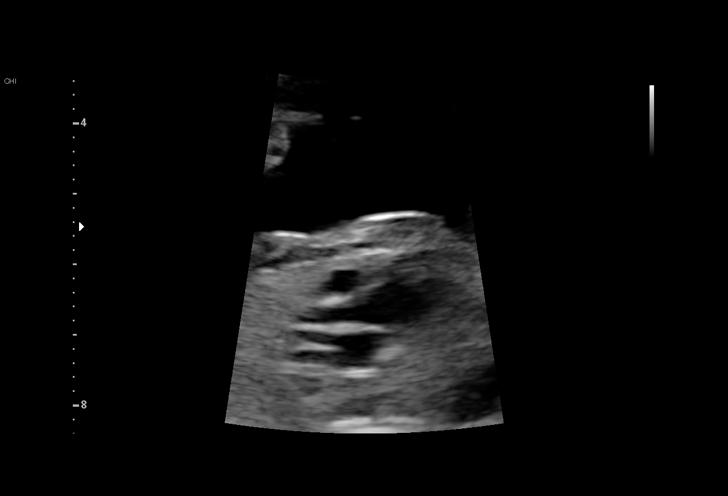

[13 of 28 positions shown; findings below may reference images not displayed]

[REDACTED]
                                                            Healthcare
                   WALKER CNM

Indications

 Obesity complicating pregnancy, second
 trimester BMI=33.47
 Encounter for antenatal screening for
 malformations
 19 weeks gestation of pregnancy
 LR NIPS male
Fetal Evaluation

 Num Of Fetuses:         1
 Fetal Heart Rate(bpm):  131
 Cardiac Activity:       Observed
 Presentation:           Variable
 Placenta:               Posterior
 P. Cord Insertion:      Visualized, central

 Amniotic Fluid
 AFI FV:      Within normal limits

                             Largest Pocket(cm)

Biometry

 BPD:      43.6  mm     G. Age:  19w 1d         59  %    CI:         73.8   %    70 - 86
                                                         FL/HC:      18.7   %    16.1 -
 HC:      161.2  mm     G. Age:  18w 6d         38  %    HC/AC:      1.12        1.09 -
 AC:      143.7  mm     G. Age:  19w 5d         70  %    FL/BPD:     69.0   %
 FL:       30.1  mm     G. Age:  19w 2d         55  %    FL/AC:      20.9   %    20 - 24
 HUM:      28.6  mm     G. Age:  19w 2d         57  %
 CER:      18.7  mm     G. Age:  18w 3d         12  %
 NFT:       3.1  mm

 LV:        5.3  mm
 CM:        4.9  mm

 Est. FW:     293  gm    0 lb 10 oz      72  %
OB History

 Gravidity:    6         Term:   3         SAB:   2
 Living:       3
Gestational Age

 U/S Today:     19w 2d                                        EDD:   07/30/21
 Best:          19w 0d     Det. By:  Early Ultrasound         EDD:   08/01/21
                                     (12/04/20)
Anatomy

 Cranium:               Appears normal         LVOT:                   Appears normal
 Cavum:                 Appears normal         Aortic Arch:            Not well visualized
 Ventricles:            Appears normal         Ductal Arch:            Appears normal
 Choroid Plexus:        Appears normal         Diaphragm:              Not well visualized
 Cerebellum:            Appears normal         Stomach:                Appears normal, left
                                                                       sided
 Posterior Fossa:       Appears normal         Abdomen:                Appears normal
 Nuchal Fold:           Appears normal         Abdominal Wall:         Not well visualized
 Face:                  Appears normal         Cord Vessels:           Appears normal (3
                        (orbits and profile)                           vessel cord)
 Lips:                  Appears normal         Kidneys:                Appear normal
 Palate:                Appears normal         Bladder:                Appears normal
 Thoracic:              Appears normal         Spine:                  Appears normal
 Heart:                 Not well visualized    Upper Extremities:      Appears normal
 RVOT:                  Appears normal         Lower Extremities:      Appears normal

 Other:  Fetus appears to be a male.
Cervix Uterus Adnexa

 Cervix
 Length:           3.45  cm.
 Normal appearance by transabdominal scan.

 Uterus
 No abnormality visualized.

 Right Ovary
 Within normal limits.

 Left Ovary
 Within normal limits.

 Cul De Sac
 No free fluid seen.
 Adnexa
 No abnormality visualized.
Impression

 G6 P3. Patient is here for fetal anatomy scan.  Obstetric
 history significant for 3 term vaginal deliveries.

 On cell-free fetal DNA screening, the risks of fetal
 aneuploidies are not increased .MSAFP screening showed
 low risk for open-neural tube defects .

 We performed fetal anatomy scan. No makers of
 aneuploidies or fetal structural defects are seen. Fetal
 biometry is consistent with her previously-established dates.
 Amniotic fluid is normal and good fetal activity is seen.
 Patient understands the limitations of ultrasound in detecting
 fetal anomalies.
Recommendations

 -An appointment was made for her to return in 4 weeks for
 completion of fetal anatomy.
                 SHINSAKU BRUHL

## 2023-06-03 ENCOUNTER — Encounter (HOSPITAL_COMMUNITY): Payer: Self-pay

## 2023-06-03 ENCOUNTER — Inpatient Hospital Stay (HOSPITAL_COMMUNITY)
Admission: AD | Admit: 2023-06-03 | Discharge: 2023-06-04 | Disposition: A | Discharge status: Home / Self Care | Payer: Self-pay | Attending: Obstetrics and Gynecology | Admitting: Obstetrics and Gynecology

## 2023-06-03 ENCOUNTER — Other Ambulatory Visit: Payer: Self-pay

## 2023-06-03 DIAGNOSIS — O4690 Antepartum hemorrhage, unspecified, unspecified trimester: Secondary | ICD-10-CM

## 2023-06-03 DIAGNOSIS — Z331 Pregnant state, incidental: Secondary | ICD-10-CM

## 2023-06-03 DIAGNOSIS — O3680X0 Pregnancy with inconclusive fetal viability, not applicable or unspecified: Secondary | ICD-10-CM | POA: Insufficient documentation

## 2023-06-03 DIAGNOSIS — Z3A01 Less than 8 weeks gestation of pregnancy: Secondary | ICD-10-CM | POA: Insufficient documentation

## 2023-06-03 DIAGNOSIS — O209 Hemorrhage in early pregnancy, unspecified: Secondary | ICD-10-CM | POA: Insufficient documentation

## 2023-06-03 LAB — CBC
HCT: 37.6 % (ref 36.0–46.0)
Hemoglobin: 12.1 g/dL (ref 12.0–15.0)
MCH: 26.3 pg (ref 26.0–34.0)
MCHC: 32.2 g/dL (ref 30.0–36.0)
MCV: 81.7 fL (ref 80.0–100.0)
Platelets: 191 10*3/uL (ref 150–400)
RBC: 4.6 MIL/uL (ref 3.87–5.11)
RDW: 14.9 % (ref 11.5–15.5)
WBC: 12.8 10*3/uL — ABNORMAL HIGH (ref 4.0–10.5)
nRBC: 0 % (ref 0.0–0.2)

## 2023-06-03 LAB — BASIC METABOLIC PANEL
Anion gap: 4 — ABNORMAL LOW (ref 5–15)
BUN: 10 mg/dL (ref 6–20)
CO2: 26 mmol/L (ref 22–32)
Calcium: 8.7 mg/dL — ABNORMAL LOW (ref 8.9–10.3)
Chloride: 105 mmol/L (ref 98–111)
Creatinine, Ser: 0.65 mg/dL (ref 0.44–1.00)
GFR, Estimated: >60 mL/min (ref >60)
Glucose, Bld: 100 mg/dL — ABNORMAL HIGH (ref 70–99)
Potassium: 3.6 mmol/L (ref 3.5–5.1)
Sodium: 135 mmol/L (ref 135–145)

## 2023-06-03 LAB — HCG, SERUM, QUALITATIVE: Preg, Serum: POSITIVE — AB

## 2023-06-03 NOTE — ED Triage Notes (Signed)
Pt is complaining of lower abdominal pain that started last week, her breast started hurting, and she took 2 pregnancy test that were positive. Has an IUD in place. Started spotting yesterday but today she had a lot of bleeding. Did call her provider on Friday and they said that they would see her on Monday for a pregnancy test. Here now to see why she is bleeding.

## 2023-06-03 NOTE — ED Provider Notes (Signed)
[place] EMERGENCY DEPARTMENT AT [facility] Provider Note   CSN: [id number] Arrival date & time: 06/03/23  2243     History {Add pertinent medical, surgical, social history, OB history to HPI:1} Chief Complaint  Patient presents with   Abdominal Pain    Brandi Austin is a 33 y.o. female.  Patient with pelvic pain and vaginal bleeding.  She reports lower abdominal cramping and pelvic pain for the past 5 days.  Felt like her menses was coming on but then she took a pregnancy test at home which was positive.  Her last menstrual cycle was November 18.  Today she has had multiple episodes of spotting up to 3-4 pads worth with increased bleeding.  Lower pelvic pain feels like her abnormal menses.  She is concerned because she has an IUD and should not be pregnant.  Has low back pain as well.  No fevers, chills, nausea, vomiting, dizziness or lightheadedness.  The history is provided by the patient.  Abdominal Pain Associated symptoms: nausea   Associated symptoms: no chest pain, no cough, no dysuria, no fever, no hematuria, no shortness of breath and no vomiting        Home Medications Prior to Admission medications   Medication Sig Start Date End Date Taking? Authorizing Provider  ferrous sulfate 325 (65 FE) MG tablet Take 1 tablet (325 mg total) by mouth every other day. 07/31/21   Raelyn Mora, CNM  Prenatal Vit-Fe Fumarate-FA (PRENATAL MULTIVITAMIN) TABS tablet Take 1 tablet by mouth daily at 12 noon.    [provider]      Allergies    Parsley seed    Review of Systems   Review of Systems  Constitutional:  Negative for activity change, appetite change and fever.  HENT:  Negative for congestion and rhinorrhea.   Respiratory:  Negative for cough, chest tightness and shortness of breath.   Cardiovascular:  Negative for chest pain.  Gastrointestinal:  Positive for abdominal pain and nausea. Negative for vomiting.  Genitourinary:   Negative for dysuria and hematuria.  Musculoskeletal:  Negative for arthralgias and myalgias.  Skin:  Negative for rash.  Neurological:  Negative for dizziness, weakness and headaches.   all other systems are negative except as noted in the HPI and PMH.    Physical Exam Updated Vital Signs BP 128/75 (BP Location: Right Arm)   Pulse 81   Temp 98.3 F (36.8 C) (Oral)   Resp 16   Ht 4\' 9"  (1.448 m)   Wt 74.8 kg   LMP 04/30/2023   SpO2 100%   BMI 35.71 kg/m  Physical Exam Vitals and nursing note reviewed.  Constitutional:      General: She is not in acute distress.    Appearance: She is well-developed.  HENT:     Head: Normocephalic and atraumatic.     Mouth/Throat:     Pharynx: No oropharyngeal exudate.  Eyes:     Conjunctiva/sclera: Conjunctivae normal.     Pupils: Pupils are equal, round, and reactive to light.  Neck:     Comments: No meningismus. Cardiovascular:     Rate and Rhythm: Normal rate and regular rhythm.     Heart sounds: Normal heart sounds. No murmur heard. Pulmonary:     Effort: Pulmonary effort is normal. No respiratory distress.     Breath sounds: Normal breath sounds.  Abdominal:     Palpations: Abdomen is soft.     Tenderness: There is abdominal tenderness. There is no guarding  or rebound.     Comments: Suprapubic tenderness. No RLQ tenderness.  Genitourinary:    Comments: Chaperone present Coop NT. Normal external genitalia. Dark blood in vaginal vault obscuring cervix, IUD string not seen.  No CMT. No lateralizing adnexal tenderness Musculoskeletal:        General: No tenderness. Normal range of motion.     Cervical back: Normal range of motion and neck supple.  Skin:    General: Skin is warm.  Neurological:     Mental Status: She is alert and oriented to person, place, and time.     Cranial Nerves: No cranial nerve deficit.     Motor: No abnormal muscle tone.     Coordination: Coordination normal.     Comments: No ataxia on finger to nose  bilaterally. No pronator drift. 5/5 strength throughout. CN 2-12 intact.Equal grip strength. Sensation intact.   Psychiatric:        Behavior: Behavior normal.     ED Results / Procedures / Treatments   Labs (all labs ordered are listed, but only abnormal results are displayed) Labs Reviewed  CBC - Abnormal; Notable for the following components:      Result Value   WBC 12.8 (*)    All other components within normal limits  BASIC METABOLIC PANEL - Abnormal; Notable for the following components:   Glucose, Bld 100 (*)    Calcium 8.7 (*)    Anion gap 4 (*)    All other components within normal limits  WET PREP, GENITAL  HCG, SERUM, QUALITATIVE  HCG, QUANTITATIVE, PREGNANCY  ABO/RH  GC/CHLAMYDIA PROBE AMP ([place]) NOT AT [facility]    EKG None  Radiology No results found.  Procedures Procedures  {Document cardiac monitor, telemetry assessment procedure when appropriate:1}  Medications Ordered in ED Medications - No data to display  ED Course/ Medical Decision Making/ A&P   {   Click here for ABCD2, HEART and other calculatorsREFRESH Note before signing :1}                              Medical Decision Making Amount and/or Complexity of Data Reviewed Labs: ordered. Decision-making details documented in ED Course. Radiology: ordered and independent interpretation performed. Decision-making details documented in ED Course. ECG/medicine tests: ordered and independent interpretation performed. Decision-making details documented in ED Course.   Pelvic pain with vaginal bleeding, IUD in place. +HCG at home.  Vitals stable. No distress.  hCG is positive.  Will add quantitative hCG as well as Rh factor.  Discussed with MAU midwife Edd Arbour.  She accepts patient in transfer.  Will obtain ultrasound and MAU. {Document critical care time when appropriate:1} {Document review of labs and clinical decision tools ie heart score, Chads2Vasc2 etc:1}  {Document your  independent review of radiology images, and any outside records:1} {Document your discussion with family members, caretakers, and with consultants:1} {Document social determinants of health affecting pt's care:1} {Document your decision making why or why not admission, treatments were needed:1} Final Clinical Impression(s) / ED Diagnoses Final diagnoses:  None    Rx / DC Orders ED Discharge Orders     None

## 2023-06-04 ENCOUNTER — Inpatient Hospital Stay (HOSPITAL_COMMUNITY): Payer: Self-pay

## 2023-06-04 ENCOUNTER — Other Ambulatory Visit (HOSPITAL_COMMUNITY): Payer: Self-pay

## 2023-06-04 ENCOUNTER — Encounter (HOSPITAL_COMMUNITY): Payer: Self-pay | Admitting: *Deleted

## 2023-06-04 DIAGNOSIS — Z3A01 Less than 8 weeks gestation of pregnancy: Secondary | ICD-10-CM

## 2023-06-04 DIAGNOSIS — O4691 Antepartum hemorrhage, unspecified, first trimester: Secondary | ICD-10-CM

## 2023-06-04 LAB — WET PREP, GENITAL
Clue Cells Wet Prep HPF POC: NONE SEEN
Sperm: NONE SEEN
Trich, Wet Prep: NONE SEEN
WBC, Wet Prep HPF POC: <10 (ref <10)
Yeast Wet Prep HPF POC: NONE SEEN

## 2023-06-04 LAB — HCG, QUANTITATIVE, PREGNANCY: hCG, Beta Chain, Quant, S: 105 m[IU]/mL — ABNORMAL HIGH (ref <5)

## 2023-06-04 NOTE — MAU Provider Note (Signed)
Chief Complaint:  Abdominal Pain  HPI  Brandi Austin is a 32 y.o. [id number] at [redacted]w[redacted]d who presents to maternity admissions reporting an IUD in place (placed by [facility] in late August) with a positive pregnancy test. Had a regular period in September, October and November but then was due on the 17th and it never came so she took a pregnancy test and it was positive. Started having spotting today which then progressed to heavier bleeding this afternoon with cramping. Has since lightened up considerably (spotting only with mild intermittent cramping of a 4/10, not localized to one side or the other).   Does not ever recall noting abnormal cramping or expulsion of IUD prior.   Notes that at her IUD string check, they had to use the cytology brush in order to see her IUD strings but she was told everything was in place appropriately.  Pregnancy Course: Has not established OB care yet, does not plan to continue this pregnancy.  Past Medical History:  Diagnosis Date   Chlamydia    Complete abortion 07/27/2011   Gestational diabetes 06/16/2021   Normal postpartum 2 hr GTT   Medical history non-contributory    OB History  Gravida Para Term Preterm AB Living  6 4 4  1 4   SAB IAB Ectopic Multiple Live Births  1   0 4    # Outcome Date GA Lbr Len/2nd Weight Sex Type Anes PTL Lv  6 Current           5 Term 07/28/21 [redacted]w[redacted]d 15:48 / 00:19 8 lb 9.2 oz (3.89 kg) M Vag-Spont EPI  LIV     Birth Comments: WNL  4 SAB 08/26/20          3 Term 02/02/19 [redacted]w[redacted]d 07:52 / 00:16 7 lb 4.6 oz (3.306 kg) F Vag-Spont EPI  LIV     Birth Comments: none  2 Term 05/20/13    F Vag-Spont  N LIV  1 Term 05/05/12 [redacted]w[redacted]d 11:59 / 00:25 6 lb 12 oz (3.062 kg) F Vag-Spont EPI  LIV   Past Surgical History:  Procedure Laterality Date   left elbow surgery     Family History  Problem Relation Age of Onset   Healthy Father    Healthy Mother    Anesthesia problems Neg Hx    Social History   Tobacco Use    Smoking status: Never   Smokeless tobacco: Never  Vaping Use   Vaping status: Former   Substances: Nicotine  Substance Use Topics   Alcohol use: No    Comment: prior to pregnancy   Drug use: No   Allergies  Allergen Reactions   Parsley Seed Rash   Medications Prior to Admission  Medication Sig Dispense Refill Last Dose/Taking   ferrous sulfate 325 (65 FE) MG tablet Take 1 tablet (325 mg total) by mouth every other day. 30 tablet 3    Prenatal Vit-Fe Fumarate-FA (PRENATAL MULTIVITAMIN) TABS tablet Take 1 tablet by mouth daily at 12 noon.      I have reviewed patient's Past Medical Hx, Surgical Hx, Family Hx, Social Hx, medications and allergies.   ROS  Pertinent items noted in HPI and remainder of comprehensive ROS otherwise negative.   PHYSICAL EXAM  Patient Vitals for the past 24 hrs:  BP Temp Temp src Pulse Resp SpO2 Height Weight  06/04/23 0055 120/72 98.2 F (36.8 C) -- 72 18 -- -- --  06/03/23 2254 -- -- -- -- -- -- 4\' 9"  (1.448  m) 165 lb (74.8 kg)  06/03/23 2247 128/75 98.3 F (36.8 C) Oral 81 16 100 % -- --   Constitutional: Well-developed, well-nourished female in no acute distress.  Cardiovascular: normal rate & rhythm, warm and well-perfused Respiratory: normal effort, no problems with respiration noted GI: Abd soft, non-tender, non-distended MS: Extremities nontender, no edema, normal ROM Neurologic: Alert and oriented x 4.  GU: no CVA tenderness Pelvic: Exam deferred   Labs: Results for orders placed or performed during the hospital encounter of 06/03/23 (from the past 24 hours)  hCG, serum, qualitative     Status: Abnormal   Collection Time: 06/03/23 10:58 PM  Result Value Ref Range   Preg, Serum POSITIVE (A) NEGATIVE  CBC     Status: Abnormal   Collection Time: 06/03/23 10:58 PM  Result Value Ref Range   WBC 12.8 (H) 4.0 - 10.5 K/uL   RBC 4.60 3.87 - 5.11 MIL/uL   Hemoglobin 12.1 12.0 - 15.0 g/dL   HCT 96.0 45.4 - 09.8 %   MCV 81.7 80.0 - 100.0 fL    MCH 26.3 26.0 - 34.0 pg   MCHC 32.2 30.0 - 36.0 g/dL   RDW 11.9 14.7 - 82.9 %   Platelets 191 150 - 400 K/uL   nRBC 0.0 0.0 - 0.2 %  Basic metabolic panel     Status: Abnormal   Collection Time: 06/03/23 10:58 PM  Result Value Ref Range   Sodium 135 135 - 145 mmol/L   Potassium 3.6 3.5 - 5.1 mmol/L   Chloride 105 98 - 111 mmol/L   CO2 26 22 - 32 mmol/L   Glucose, Bld 100 (H) 70 - 99 mg/dL   BUN 10 6 - 20 mg/dL   Creatinine, Ser 5.62 0.44 - 1.00 mg/dL   Calcium 8.7 (L) 8.9 - 10.3 mg/dL   GFR, Estimated >13 >08 mL/min   Anion gap 4 (L) 5 - 15  Wet prep, genital     Status: None   Collection Time: 06/03/23 11:16 PM  Result Value Ref Range   Yeast Wet Prep HPF POC NONE SEEN NONE SEEN   Trich, Wet Prep NONE SEEN NONE SEEN   Clue Cells Wet Prep HPF POC NONE SEEN NONE SEEN   WBC, Wet Prep HPF POC <10 <10   Sperm NONE SEEN   hCG, quantitative, pregnancy     Status: Abnormal   Collection Time: 06/03/23 11:36 PM  Result Value Ref Range   hCG, Beta Chain, Quant, S 105 (H) <5 mIU/mL  ABO/Rh     Status: None   Collection Time: 06/03/23 11:41 PM  Result Value Ref Range   ABO/RH(D)      O POS Performed at [facility], [address], [place], [place] [address]    Imaging:  US OB Transvaginal Result Date: 06/04/2023 CLINICAL DATA:  Bleeding, cramping, IUD in place EXAM: TRANSVAGINAL OB ULTRASOUND TECHNIQUE: Transvaginal ultrasound was performed for complete evaluation of the gestation as well as the maternal uterus, adnexal regions, and pelvic cul-de-sac. COMPARISON:  None Available. FINDINGS: Intrauterine gestational sac: Single Yolk sac:  Not Visualized. Embryo:  Not Visualized. Cardiac Activity: Not Visualized. Heart Rate:  bpm MSD: 12.7 mm   6 w   1 d CRL:     mm    w  d                  [place] EDC: Subchorionic hemorrhage:  None visualized. Maternal uterus/adnexae: No IUD visualized. No adnexal  mass. Small amount of free fluid in the pelvis. IMPRESSION: Six week 1 day intrauterine  gestational sac. No yolk sac or fetal pole currently. This could be followed with repeat ultrasound in 14 days to ensure expected progression. No IUD visualized. Small amount of free fluid in the pelvis. Electronically Signed   By: Charlett Nose M.D.   On: 06/04/2023 01:26   MDM & MAU COURSE  MDM: Low  MAU Course: Orders Placed This Encounter  Procedures   Wet prep, genital   US OB Transvaginal   hCG, serum, qualitative   CBC   Basic metabolic panel   hCG, quantitative, pregnancy   Diet NPO time specified   Pelvic cart to bedside   Pelvic cart   ABO/Rh   Discharge patient   No orders of the defined types were placed in this encounter.  Reviewed labs from ED and sent pt to u/s.  Reviewed results of u/s which showed IUGS but no IUD. Pt understandably confused about expulsion of IUD without her knowledge and is not ready to be pregnant again yet. Discussed PUL and need for repeat labs on 12/25, also gave termination options.  Ectopic precautions given.  ASSESSMENT   1. Vaginal bleeding in pregnancy   2. Pregnancy of unknown anatomic location   3. IUD failure, pregnant   4. [redacted] weeks gestation of pregnancy    PLAN  Discharge home in stable condition with return precautions.     Follow-up Information     [facility] Follow up in 2 day(s).   Specialty: Obstetrics and Gynecology Why: Any time on Christmas Day for repeat hormone level Contact information: [address] [place] [address] [phone]        [facility].   Contact information: [address] [place] [address] [phone]                 Allergies as of 06/04/2023       Reactions   Parsley Seed Rash        Medication List     TAKE these medications    ferrous sulfate 325 (65 FE) MG tablet Take 1 tablet (325 mg total) by mouth every other day.   prenatal multivitamin Tabs tablet Take 1 tablet by mouth daily at 12  noon.       Edd Arbour, CNM, MSN, IBCLC Certified Nurse Midwife, [facility]

## 2023-06-04 NOTE — MAU Note (Signed)
Pt transferred from [facility] with abd pain and vag bleeding. Pt has IUD placed in August. Did not have a period this month and test and it was positive. Started having some spotting this week and lower abd cramping like her period was going to started. Stared having heavier bleeding to day and went to the ED.

## 2023-06-04 NOTE — Discharge Instructions (Signed)
[facility] [place] in [place] or can order Plan C pills online from [email or web address]

## 2023-06-05 LAB — GC/CHLAMYDIA PROBE AMP (~~LOC~~) NOT AT ARMC
Chlamydia: NEGATIVE
Comment: NEGATIVE
Comment: NORMAL
Neisseria Gonorrhea: NEGATIVE

## 2023-06-05 LAB — ABO/RH: ABO/RH(D): O POS

## 2023-06-06 ENCOUNTER — Other Ambulatory Visit: Payer: Self-pay | Admitting: Certified Nurse Midwife

## 2023-06-06 ENCOUNTER — Inpatient Hospital Stay (HOSPITAL_COMMUNITY)
Admit: 2023-06-06 | Discharge: 2023-06-06 | Discharge status: Home / Self Care | Payer: Self-pay | Attending: Obstetrics and Gynecology

## 2023-06-06 ENCOUNTER — Inpatient Hospital Stay (HOSPITAL_COMMUNITY)
Admission: AD | Admit: 2023-06-06 | Discharge: 2023-06-06 | Disposition: A | Discharge status: Home / Self Care | Payer: Self-pay | Attending: Obstetrics and Gynecology | Admitting: Obstetrics and Gynecology

## 2023-06-06 ENCOUNTER — Ambulatory Visit (HOSPITAL_COMMUNITY): Admission: RE | Admit: 2023-06-06 | Payer: Self-pay | Source: Home / Self Care

## 2023-06-06 DIAGNOSIS — Z3A01 Less than 8 weeks gestation of pregnancy: Secondary | ICD-10-CM | POA: Insufficient documentation

## 2023-06-06 DIAGNOSIS — O4690 Antepartum hemorrhage, unspecified, unspecified trimester: Secondary | ICD-10-CM

## 2023-06-06 DIAGNOSIS — O3680X0 Pregnancy with inconclusive fetal viability, not applicable or unspecified: Secondary | ICD-10-CM

## 2023-06-06 DIAGNOSIS — O4691 Antepartum hemorrhage, unspecified, first trimester: Secondary | ICD-10-CM | POA: Insufficient documentation

## 2023-06-06 DIAGNOSIS — T8331XA Breakdown (mechanical) of intrauterine contraceptive device, initial encounter: Secondary | ICD-10-CM | POA: Insufficient documentation

## 2023-06-06 DIAGNOSIS — Z3009 Encounter for other general counseling and advice on contraception: Secondary | ICD-10-CM

## 2023-06-06 LAB — HCG, QUANTITATIVE, PREGNANCY: hCG, Beta Chain, Quant, S: 44 m[IU]/mL — ABNORMAL HIGH (ref <5)

## 2023-06-06 NOTE — Progress Notes (Signed)
Event Date/Time   First Provider Initiated Contact with Patient 06/06/23 1647      S [name] is a 32 y.o. [id number] pregnant/non-pregnant female at [redacted]w[redacted]d who presents to MAU today with complaint of follow up HcG with vaginal bleeding.   Has not yet established care, does not plan to continue this pregnancy. Reviewed recent MAU/ER notes.   Pertinent items noted in HPI and remainder of comprehensive ROS otherwise negative.   O BP 116/72 (BP Location: Right Arm)   Pulse 71   Temp 98.6 F (37 C)   Resp 17   LMP 04/30/2023   SpO2 100%  Physical Exam Vitals reviewed.  Constitutional:      General: She is not in acute distress.    Appearance: Normal appearance. She is not ill-appearing, toxic-appearing or diaphoretic.  HENT:     Head: Normocephalic.  Cardiovascular:     Rate and Rhythm: Normal rate.     Pulses: Normal pulses.  Pulmonary:     Effort: Pulmonary effort is normal.  Skin:    General: Skin is warm and dry.     Capillary Refill: Capillary refill takes less than 2 seconds.  Neurological:     Mental Status: She is alert and oriented to person, place, and time.  Psychiatric:        Mood and Affect: Mood normal.        Behavior: Behavior normal.        Thought Content: Thought content normal.        Judgment: Judgment normal.      MDM: Reviewed notes Labs   MAU Course: Follow up betaHcG   A Vaginal bleeding in pregnancy [redacted] weeks gestation IUD failure Unwanted fertility  Medical screening exam complete  P Discharge from MAU in stable condition with strict bleeding precautions Patient desires a phone call with results rather than waiting. CNM agrees this is reasonable, ensured the contact information is correct.   Reassessment (5:40 PM) Beta HcG levels dropping appropriately from 105 to 44 in <72 hours. Communicated this information to the patient via phone, using 2 identifiers to ensure I was speaking to the correct person.  Scheduled appointment for 06/08/23 afternoon for repeat labs.  Informed patient that this is likely consistent with SAB, but that we would recommend following the trend. Patient verbalized understanding.   Allergies as of 06/06/2023       Reactions   Parsley Seed Rash        Medication List     TAKE these medications    ferrous sulfate 325 (65 FE) MG tablet Take 1 tablet (325 mg total) by mouth every other day.   prenatal multivitamin Tabs tablet Take 1 tablet by mouth daily at 12 noon.        Richardson Landry, CNM 06/06/2023 5:40 PM

## 2023-06-08 ENCOUNTER — Inpatient Hospital Stay (HOSPITAL_COMMUNITY)
Admission: AD | Admit: 2023-06-08 | Discharge: 2023-06-08 | Disposition: A | Discharge status: Home / Self Care | Payer: Self-pay | Attending: Obstetrics & Gynecology | Admitting: Obstetrics & Gynecology

## 2023-06-08 ENCOUNTER — Inpatient Hospital Stay (HOSPITAL_COMMUNITY)
Admission: RE | Admit: 2023-06-08 | Discharge: 2023-06-08 | Disposition: A | Discharge status: Home / Self Care | Payer: Self-pay | Source: Ambulatory Visit | Attending: *Deleted | Admitting: *Deleted

## 2023-06-08 DIAGNOSIS — Z3A01 Less than 8 weeks gestation of pregnancy: Secondary | ICD-10-CM | POA: Insufficient documentation

## 2023-06-08 DIAGNOSIS — O039 Complete or unspecified spontaneous abortion without complication: Secondary | ICD-10-CM

## 2023-06-08 DIAGNOSIS — O209 Hemorrhage in early pregnancy, unspecified: Secondary | ICD-10-CM | POA: Insufficient documentation

## 2023-06-08 LAB — HCG, QUANTITATIVE, PREGNANCY: hCG, Beta Chain, Quant, S: 35 m[IU]/mL — ABNORMAL HIGH (ref <5)

## 2023-06-08 NOTE — MAU Note (Signed)
.  Brandi Austin is a 32 y.o. at [redacted]w[redacted]d here in MAU reporting: Here for follow up hcg. She reports intermittent lower abdominal cramps as well as vaginal bleeding. She reports passing very tiny blood clots. Does not desire pain relief.  Leanora Cover, MD, in triage.   Pain score: 7/10 lower abdomen  Vitals:   06/08/23 1708  BP: 121/71  Pulse: 64  Resp: 16  Temp: 98 F (36.7 C)  SpO2: 100%     FHT: n/a Lab orders placed from triage: hcg

## 2023-06-08 NOTE — MAU Provider Note (Signed)
History   Chief Complaint:  Follow-up   Brandi Austin is  32 y.o. [id number] Patient's last menstrual period was 04/30/2023.Marland Kitchen Patient is here for follow up of quantitative HCG and ongoing surveillance of pregnancy status.   She is [redacted]w[redacted]d weeks gestation  by LMP, early ultrasound.    Since her last visit, the patient is without new complaint.   The patient reports moderate bleeding as similar to period, changing pads every 3 hours.  7/10 cramping, declined Tylenol or Motrin.  General ROS:  negative  Her previous Quantitative HCG values are:105(12/22) -> 44(12/25)      Physical Exam   Blood pressure 121/71, pulse 64, temperature 98 F (36.7 C), temperature source Oral, resp. rate 16, height 4\' 9"  (1.448 m), weight 77.2 kg, last menstrual period 04/30/2023, SpO2 100%, currently breastfeeding.  Focused Gynecological Exam: examination not indicated  Labs: No results found for this or any previous visit (from the past 24 hours).  Ultrasound Studies:   US OB Transvaginal Result Date: 06/04/2023 CLINICAL DATA:  Bleeding, cramping, IUD in place EXAM: TRANSVAGINAL OB ULTRASOUND TECHNIQUE: Transvaginal ultrasound was performed for complete evaluation of the gestation as well as the maternal uterus, adnexal regions, and pelvic cul-de-sac. COMPARISON:  None Available. FINDINGS: Intrauterine gestational sac: Single Yolk sac:  Not Visualized. Embryo:  Not Visualized. Cardiac Activity: Not Visualized. Heart Rate:  bpm MSD: 12.7 mm   6 w   1 d CRL:     mm    w  d                  [place] EDC: Subchorionic hemorrhage:  None visualized. Maternal uterus/adnexae: No IUD visualized. No adnexal mass. Small amount of free fluid in the pelvis. IMPRESSION: Six week 1 day intrauterine gestational sac. No yolk sac or fetal pole currently. This could be followed with repeat ultrasound in 14 days to ensure expected progression. No IUD visualized. Small amount of free fluid in the pelvis. Electronically  Signed   By: Charlett Nose M.D.   On: 06/04/2023 01:26    Assessment:  [redacted]w[redacted]d weeks gestation  by LMP, early ultrasound 2/2 IUD failure  - HCG levels: 105(12/22) -> 44(12/25) -> 35 today  Plan: The patient is instructed to follow up in in 2 days for repeat bHCG here in MAU again due to weekend.  Return precautions provided  Wyn Forster 06/08/2023, 5:16 PM

## 2023-06-11 ENCOUNTER — Inpatient Hospital Stay (HOSPITAL_COMMUNITY)
Admission: AD | Admit: 2023-06-11 | Discharge: 2023-06-11 | Disposition: A | Discharge status: Home / Self Care | Payer: Self-pay | Attending: Obstetrics and Gynecology | Admitting: Obstetrics and Gynecology

## 2023-06-11 DIAGNOSIS — O039 Complete or unspecified spontaneous abortion without complication: Secondary | ICD-10-CM

## 2023-06-11 DIAGNOSIS — Z3A01 Less than 8 weeks gestation of pregnancy: Secondary | ICD-10-CM

## 2023-06-11 NOTE — Discharge Instructions (Signed)
Dear [facility],   Ms. Hernandez-Bruno had a spontaneous miscarriage on 06/06/23.  It is not necessary for any kind of "waiting period" prior to starting birth control.  The hyper-coagulable state that is present for 6 weeks after a live birth (and therefore would want to avoid estrogen containing BC) does not apply to a SAB, and a "waiting period for the body to heal from a miscarriage" is not evidenced based.  I sent in a prescription for Nuva Ring at Ms. Hernandez-Bruno's request, but it will most likely be very costly, and she would love to be able to get her birth control from your facility. Thank you!    Jacklyn Shell, CNM

## 2023-06-11 NOTE — MAU Note (Signed)
Pt seen by Cathie Beams, CNM prior to triage by RN. CNM assessed and discharge pt.

## 2023-08-29 ENCOUNTER — Ambulatory Visit (INDEPENDENT_AMBULATORY_CARE_PROVIDER_SITE_OTHER)

## 2023-08-29 ENCOUNTER — Ambulatory Visit (HOSPITAL_COMMUNITY)
Admission: EM | Admit: 2023-08-29 | Discharge: 2023-08-29 | Disposition: A | Discharge status: Home / Self Care | Attending: Emergency Medicine | Admitting: Emergency Medicine

## 2023-08-29 ENCOUNTER — Encounter (HOSPITAL_COMMUNITY): Payer: Self-pay

## 2023-08-29 DIAGNOSIS — B9789 Other viral agents as the cause of diseases classified elsewhere: Secondary | ICD-10-CM | POA: Diagnosis not present

## 2023-08-29 DIAGNOSIS — R0602 Shortness of breath: Secondary | ICD-10-CM

## 2023-08-29 DIAGNOSIS — J988 Other specified respiratory disorders: Secondary | ICD-10-CM | POA: Diagnosis not present

## 2023-08-29 LAB — POC COVID19/FLU A&B COMBO
Covid Antigen, POC: NEGATIVE
Influenza A Antigen, POC: NEGATIVE
Influenza B Antigen, POC: NEGATIVE

## 2023-08-29 NOTE — ED Provider Notes (Signed)
 [facility]    CSN: [id number] Arrival date & time: 08/29/23  0909      History   Chief Complaint Chief Complaint  Patient presents with   Cough   Fever   Generalized Body Aches   Headache    HPI Brandi Austin is a 33 y.o. female.   Patient presents with cough, body aches, headache, and fever x 4 days.  Patient states that yesterday she began to have some mild shortness of breath and chest pain with deep breathing.  Denies nausea, vomiting, diarrhea, and abdominal pain.  Denies history of asthma.  Patient states she has been taking TheraFlu, drinking tea, and ibuprofen with some relief of symptoms.   Cough Associated symptoms: fever and headaches   Fever Associated symptoms: cough and headaches   Headache Associated symptoms: cough and fever     Past Medical History:  Diagnosis Date   Chlamydia    Complete abortion 07/27/2011   Gestational diabetes 06/16/2021   Normal postpartum 2 hr GTT   Medical history non-contributory     Patient Active Problem List   Diagnosis Date Noted   Atypical squamous cell changes of undetermined significance (ASCUS) on cervical cytology with negative high risk human papilloma virus (HPV) test result on 08/26/2021 02/03/2021    Past Surgical History:  Procedure Laterality Date   left elbow surgery      OB History     Gravida  6   Para  4   Term  4   Preterm      AB  1   Living  4      SAB  1   IAB      Ectopic      Multiple  0   Live Births  4            Home Medications    Prior to Admission medications   Not on File    Family History Family History  Problem Relation Age of Onset   Healthy Father    Healthy Mother    Anesthesia problems Neg Hx     Social History Social History   Tobacco Use   Smoking status: Never   Smokeless tobacco: Never  Vaping Use   Vaping status: Former   Substances: Nicotine  Substance Use Topics   Alcohol use: No     Comment: prior to pregnancy   Drug use: No     Allergies   Parsley seed   Review of Systems Review of Systems  Constitutional:  Positive for fever.  Respiratory:  Positive for cough.   Neurological:  Positive for headaches.   Per HPI  Physical Exam Triage Vital Signs ED Triage Vitals  Encounter Vitals Group     BP      Systolic BP Percentile      Diastolic BP Percentile      Pulse      Resp      Temp      Temp src      SpO2      Weight      Height      Head Circumference      Peak Flow      Pain Score      Pain Loc      Pain Education      Exclude from Growth Chart    No data found.  Updated Vital Signs BP 111/78 (BP Location: Left Arm)   Pulse 79   Temp  98.4 F (36.9 C) (Oral)   Resp 14   LMP 08/05/2023 (Exact Date)   SpO2 97%   Breastfeeding Unknown   Visual Acuity Right Eye Distance:   Left Eye Distance:   Bilateral Distance:    Right Eye Near:   Left Eye Near:    Bilateral Near:     Physical Exam Vitals and nursing note reviewed.  Constitutional:      General: She is awake. She is not in acute distress.    Appearance: Normal appearance. She is well-developed and well-groomed. She is not ill-appearing.  HENT:     Right Ear: Tympanic membrane, ear canal and external ear normal.     Left Ear: Tympanic membrane, ear canal and external ear normal.     Nose: Congestion and rhinorrhea present.     Mouth/Throat:     Mouth: Mucous membranes are moist.     Pharynx: Posterior oropharyngeal erythema present. No oropharyngeal exudate.  Cardiovascular:     Rate and Rhythm: Normal rate and regular rhythm.  Pulmonary:     Effort: Pulmonary effort is normal.     Breath sounds: Normal breath sounds.  Skin:    General: Skin is warm and dry.  Neurological:     Mental Status: She is alert.  Psychiatric:        Behavior: Behavior is cooperative.      UC Treatments / Results  Labs (all labs ordered are listed, but only abnormal results are  displayed) Labs Reviewed  POC COVID19/FLU A&B COMBO    EKG   Radiology DG Chest 2 View Result Date: 08/29/2023 CLINICAL DATA:  Cough and fever for several days. Shortness of breath. EXAM: CHEST - 2 VIEW COMPARISON:  04/04/2007 FINDINGS: The heart size and mediastinal contours are within normal limits. Both lungs are clear. The visualized skeletal structures are unremarkable. IMPRESSION: No active cardiopulmonary disease. Electronically Signed   By: Danae Orleans M.D.   On: 08/29/2023 11:49    Procedures Procedures (including critical care time)  Medications Ordered in UC Medications - No data to display  Initial Impression / Assessment and Plan / UC Course  I have reviewed the triage vital signs and the nursing notes.  Pertinent labs & imaging results that were available during my care of the patient were reviewed by me and considered in my medical decision making (see chart for details).     Upon assessment congestion and rhinorrhea present, mild erythema noted to pharynx.  Lungs clear bilaterally to auscultation.  SpO2 97%.  COVID and flu testing are negative.  Based on my interpretation there is no cardiopulmonary disease noted on chest x-ray.  Radiology report confirms this.  Discussed symptomatic treatment for viral illness related symptoms.  Discussed follow-up and return precautions. Final Clinical Impressions(s) / UC Diagnoses   Final diagnoses:  Shortness of breath  Viral respiratory illness     Discharge Instructions      As discussed based on my interpretation your x-ray did not reveal any pneumonia today, if radiology report reveals differently I will give you a call later today.   I believe your symptoms are from a viral illness.  You can alternate between Tylenol and Ibuprofen as needed for pain and fever.  There is Tylenol in TheraFlu and you can continue taking this, just do not take it with additional Tylenol.   I recommend Mucinex for cough and  congestion as needed.  Stay hydrated and get plenty of rest.  Return here if symptoms persist  or worsen.       ED Prescriptions   None    PDMP not reviewed this encounter.   Wynonia Lawman A, NP 08/29/23 1209

## 2023-08-29 NOTE — Discharge Instructions (Addendum)
 As discussed based on my interpretation your x-ray did not reveal any pneumonia today, if radiology report reveals differently I will give you a call later today.   I believe your symptoms are from a viral illness.  You can alternate between Tylenol and Ibuprofen as needed for pain and fever.  There is Tylenol in TheraFlu and you can continue taking this, just do not take it with additional Tylenol.   I recommend Mucinex for cough and congestion as needed.  Stay hydrated and get plenty of rest.  Return here if symptoms persist or worsen.

## 2023-08-29 NOTE — ED Triage Notes (Signed)
 Patient c/o a cough, body aches, headache, and fever x 4 days.  Patient states that she has been taking Thera-flu drinking tea, and Ibuprofen. Patient last had Ibuprofen at 0600 today.

## 2024-01-05 ENCOUNTER — Other Ambulatory Visit: Payer: Self-pay

## 2024-01-05 ENCOUNTER — Emergency Department (HOSPITAL_COMMUNITY)
Admission: EM | Admit: 2024-01-05 | Discharge: 2024-01-05 | Disposition: A | Discharge status: Home / Self Care | Source: Ambulatory Visit | Attending: Emergency Medicine | Admitting: Emergency Medicine

## 2024-01-05 ENCOUNTER — Emergency Department (HOSPITAL_COMMUNITY)

## 2024-01-05 ENCOUNTER — Encounter (HOSPITAL_COMMUNITY): Payer: Self-pay

## 2024-01-05 ENCOUNTER — Ambulatory Visit (HOSPITAL_COMMUNITY)
Admission: EM | Admit: 2024-01-05 | Discharge: 2024-01-05 | Disposition: A | Discharge status: Home / Self Care | Attending: Family Medicine | Admitting: Family Medicine

## 2024-01-05 DIAGNOSIS — T8332XA Displacement of intrauterine contraceptive device, initial encounter: Secondary | ICD-10-CM | POA: Diagnosis present

## 2024-01-05 DIAGNOSIS — R739 Hyperglycemia, unspecified: Secondary | ICD-10-CM | POA: Insufficient documentation

## 2024-01-05 DIAGNOSIS — Z3202 Encounter for pregnancy test, result negative: Secondary | ICD-10-CM | POA: Diagnosis not present

## 2024-01-05 DIAGNOSIS — R1031 Right lower quadrant pain: Secondary | ICD-10-CM

## 2024-01-05 DIAGNOSIS — Z30011 Encounter for initial prescription of contraceptive pills: Secondary | ICD-10-CM | POA: Insufficient documentation

## 2024-01-05 DIAGNOSIS — Y828 Other medical devices associated with adverse incidents: Secondary | ICD-10-CM | POA: Diagnosis not present

## 2024-01-05 LAB — CBC
HCT: 38.8 % (ref 36.0–46.0)
Hemoglobin: 12.4 g/dL (ref 12.0–15.0)
MCH: 26.9 pg (ref 26.0–34.0)
MCHC: 32 g/dL (ref 30.0–36.0)
MCV: 84.2 fL (ref 80.0–100.0)
Platelets: 184 K/uL (ref 150–400)
RBC: 4.61 MIL/uL (ref 3.87–5.11)
RDW: 14.3 % (ref 11.5–15.5)
WBC: 8.5 K/uL (ref 4.0–10.5)
nRBC: 0 % (ref 0.0–0.2)

## 2024-01-05 LAB — POCT URINALYSIS DIP (MANUAL ENTRY)
Bilirubin, UA: NEGATIVE
Blood, UA: NEGATIVE
Glucose, UA: NEGATIVE mg/dL
Ketones, POC UA: NEGATIVE mg/dL
Nitrite, UA: NEGATIVE
Protein Ur, POC: NEGATIVE mg/dL
Spec Grav, UA: >=1.03 — AB (ref 1.010–1.025)
Urobilinogen, UA: 0.2 U/dL
pH, UA: 5.5 (ref 5.0–8.0)

## 2024-01-05 LAB — COMPREHENSIVE METABOLIC PANEL WITH GFR
ALT: 16 U/L (ref 0–44)
AST: 20 U/L (ref 15–41)
Albumin: 3.9 g/dL (ref 3.5–5.0)
Alkaline Phosphatase: 65 U/L (ref 38–126)
Anion gap: 9 (ref 5–15)
BUN: 12 mg/dL (ref 6–20)
CO2: 23 mmol/L (ref 22–32)
Calcium: 9.2 mg/dL (ref 8.9–10.3)
Chloride: 107 mmol/L (ref 98–111)
Creatinine, Ser: 0.45 mg/dL (ref 0.44–1.00)
GFR, Estimated: >60 mL/min (ref >60)
Glucose, Bld: 124 mg/dL — ABNORMAL HIGH (ref 70–99)
Potassium: 3.7 mmol/L (ref 3.5–5.1)
Sodium: 139 mmol/L (ref 135–145)
Total Bilirubin: 0.3 mg/dL (ref 0.0–1.2)
Total Protein: 7.3 g/dL (ref 6.5–8.1)

## 2024-01-05 LAB — LIPASE, BLOOD: Lipase: 36 U/L (ref 11–51)

## 2024-01-05 LAB — POCT URINE PREGNANCY: Preg Test, Ur: NEGATIVE

## 2024-01-05 LAB — HCG, SERUM, QUALITATIVE: Preg, Serum: NEGATIVE

## 2024-01-05 MED ORDER — IOHEXOL 300 MG/ML  SOLN
100.0000 mL | Freq: Once | INTRAMUSCULAR | Status: AC | PRN
  Administered 2024-01-05: 100 mL via INTRAVENOUS

## 2024-01-05 MED ORDER — ONDANSETRON HCL 4 MG/2ML IJ SOLN
4.0000 mg | Freq: Four times a day (QID) | INTRAMUSCULAR | Status: DC | PRN
  Administered 2024-01-05: 4 mg via INTRAVENOUS
  Filled 2024-01-05: qty 2

## 2024-01-05 MED ORDER — NORGESTIMATE-ETH ESTRADIOL 0.25-35 MG-MCG PO TABS: 1.0000 | ORAL_TABLET | Freq: Every day | ORAL | 1 refills | Status: AC

## 2024-01-05 MED ORDER — FENTANYL CITRATE PF 50 MCG/ML IJ SOSY
50.0000 ug | PREFILLED_SYRINGE | INTRAMUSCULAR | Status: DC | PRN
  Administered 2024-01-05: 50 ug via INTRAVENOUS
  Filled 2024-01-05: qty 1

## 2024-01-05 MED ORDER — OXYCODONE-ACETAMINOPHEN 5-325 MG PO TABS: 1.0000 | ORAL_TABLET | Freq: Four times a day (QID) | ORAL | 0 refills | Status: AC | PRN

## 2024-01-05 NOTE — ED Triage Notes (Signed)
 Patient reports that she has lower abdominal pain and slight dysuria that started last night. Patient denies vaginal discharge.  Patient states she has been taking ibuprofen  for her pain.

## 2024-01-05 NOTE — ED Notes (Signed)
 Patient is being discharged from the Urgent Care and sent to the Emergency Department via POV . Per Dorna Silk, FNP, patient is in need of higher level of care due to Abdominal pain. Patient is aware and verbalizes understanding of plan of care.  Vitals:   01/05/24 1309  BP: 123/79  Pulse: 73  Resp: 14  Temp: 98.4 F (36.9 C)  SpO2: 98%

## 2024-01-05 NOTE — ED Provider Notes (Signed)
 [facility]    CSN: [id number] Arrival date & time: 01/05/24  1232      History   Chief Complaint Chief Complaint  Patient presents with   Abdominal Pain   Dysuria    HPI Equilla Desired Ekblad is a 33 y.o. female.   Patient presents to urgent care for evaluation of bilateral lower abdominal pain and minimal dysuria that started last night.  Pain is most prominent on the right side of the lower abdomen and has been relatively constant since onset last night.  Pain is currently rated at an 8 on a scale of 0-10, does not radiate to the back, and does not radiate to the legs.  Denies urinary frequency, urinary urgency, fever, chills, nausea, vomiting, flank pain, and gross hematuria.  Denies vaginal symptoms and pelvic pain.  She is having normal bowel movements without straining.  She felt pain could be related to gas and has been trying to push out gas without relief.  No recent dietary changes or history of surgeries to the abdomen.  LMP 12-21-2023, denies chance of pregnancy currently.  Denies concern for STD.  She has not attempted use of any over-the-counter medications help with symptoms PTA.   Abdominal Pain Associated symptoms: dysuria   Dysuria Associated symptoms: abdominal pain     Past Medical History:  Diagnosis Date   Chlamydia    Complete abortion 07/27/2011   Gestational diabetes 06/16/2021   Normal postpartum 2 hr GTT   Medical history non-contributory     Patient Active Problem List   Diagnosis Date Noted   Atypical squamous cell changes of undetermined significance (ASCUS) on cervical cytology with negative high risk human papilloma virus (HPV) test result on 08/26/2021 02/03/2021    Past Surgical History:  Procedure Laterality Date   left elbow surgery      OB History     Gravida  6   Para  4   Term  4   Preterm      AB  1   Living  4      SAB  1   IAB      Ectopic      Multiple  0   Live Births  4             Home Medications    Prior to Admission medications   Not on File    Family History Family History  Problem Relation Age of Onset   Healthy Father    Healthy Mother    Anesthesia problems Neg Hx     Social History Social History   Tobacco Use   Smoking status: Never   Smokeless tobacco: Never  Vaping Use   Vaping status: Former   Substances: Nicotine  Substance Use Topics   Alcohol use: No    Comment: prior to pregnancy   Drug use: No     Allergies   Parsley seed   Review of Systems Review of Systems  Gastrointestinal:  Positive for abdominal pain.  Genitourinary:  Positive for dysuria.  Per HPI   Physical Exam Triage Vital Signs ED Triage Vitals  Encounter Vitals Group     BP 01/05/24 1309 123/79     Girls Systolic BP Percentile --      Girls Diastolic BP Percentile --      Boys Systolic BP Percentile --      Boys Diastolic BP Percentile --      Pulse Rate 01/05/24 1309 73  Resp 01/05/24 1309 14     Temp 01/05/24 1309 98.4 F (36.9 C)     Temp Source 01/05/24 1309 Oral     SpO2 01/05/24 1309 98 %     Weight --      Height --      Head Circumference --      Peak Flow --      Pain Score 01/05/24 1308 8     Pain Loc --      Pain Education --      Exclude from Growth Chart --    No data found.  Updated Vital Signs BP 123/79 (BP Location: Left Arm)   Pulse 73   Temp 98.4 F (36.9 C) (Oral)   Resp 14   LMP 12/21/2023 (Exact Date)   SpO2 98%   Visual Acuity Right Eye Distance:   Left Eye Distance:   Bilateral Distance:    Right Eye Near:   Left Eye Near:    Bilateral Near:     Physical Exam Vitals and nursing note reviewed.  Constitutional:      Appearance: She is not ill-appearing or toxic-appearing.  HENT:     Head: Normocephalic and atraumatic.     Right Ear: Hearing and external ear normal.     Left Ear: Hearing and external ear normal.     Nose: Nose normal.     Mouth/Throat:     Lips: Pink.  Eyes:      General: Lids are normal. Vision grossly intact. Gaze aligned appropriately.     Extraocular Movements: Extraocular movements intact.     Conjunctiva/sclera: Conjunctivae normal.  Cardiovascular:     Rate and Rhythm: Normal rate and regular rhythm.     Heart sounds: Normal heart sounds, S1 normal and S2 normal.  Pulmonary:     Effort: Pulmonary effort is normal. No respiratory distress.     Breath sounds: Normal breath sounds and air entry.  Abdominal:     General: Bowel sounds are normal.     Palpations: Abdomen is soft.     Tenderness: There is abdominal tenderness in the right lower quadrant and left lower quadrant. There is guarding. There is no right CVA tenderness or left CVA tenderness. Positive signs include McBurney's sign.  Musculoskeletal:     Cervical back: Neck supple.  Skin:    General: Skin is warm and dry.     Capillary Refill: Capillary refill takes less than 2 seconds.     Findings: No rash.  Neurological:     General: No focal deficit present.     Mental Status: She is alert and oriented to person, place, and time. Mental status is at baseline.     Cranial Nerves: No dysarthria or facial asymmetry.  Psychiatric:        Mood and Affect: Mood normal.        Speech: Speech normal.        Behavior: Behavior normal.        Thought Content: Thought content normal.        Judgment: Judgment normal.      UC Treatments / Results  Labs (all labs ordered are listed, but only abnormal results are displayed) Labs Reviewed  POCT URINALYSIS DIP (MANUAL ENTRY) - Abnormal; Notable for the following components:      Result Value   Clarity, UA cloudy (*)    Spec Grav, UA >=1.030 (*)    Leukocytes, UA Trace (*)    All other components within  normal limits  POCT URINE PREGNANCY    EKG   Radiology No results found.  Procedures Procedures (including critical care time)  Medications Ordered in UC Medications - No data to display  Initial Impression / Assessment  and Plan / UC Course  I have reviewed the triage vital signs and the nursing notes.  Pertinent labs & imaging results that were available during my care of the patient were reviewed by me and considered in my medical decision making (see chart for details).   1.  Right lower quadrant abdominal pain Positive McBurney sign with guarding to the right lower quadrant on exam.  Urinalysis is unremarkable for signs of UTI.  Urine pregnancy test is negative. Differential includes acute appendicitis, colitis, ovarian torsion, ovarian cyst, PID, etc. Given abdominal findings on exam and positive need for any sign, I would like for her to proceed to the nearest emergency department for stat labs and potential imaging to rule out acute intra-abdominal emergent finding.  Discussed clinical concerns/exam findings leading to recommendation for further workup in the ER setting and risks of deferring ER visit with patient/family. Patient/family express understanding and agreement with plan, discharged to ER via private car.  Final Clinical Impressions(s) / UC Diagnoses   Final diagnoses:  Right lower quadrant abdominal pain     Discharge Instructions      Please go to the nearest emergency department for further workup and evaluation.     ED Prescriptions   None    PDMP not reviewed this encounter.   Enedelia Dorna HERO, [place] 01/05/24 1421

## 2024-01-05 NOTE — ED Provider Notes (Signed)
 [place] EMERGENCY DEPARTMENT AT [facility] Provider Note   CSN: [id number] Arrival date & time: 01/05/24  1422     Patient presents with: Abdominal Pain   Brandi Austin is a 33 y.o. female.  Patient without significant history presents to the emergency department concerns of abdominal pain.  She reportedly seen at urgent care earlier today advised come to the emergency department for concerns of appendicitis rule out.  She reports that she began to experience lower abdominal pain starting last night.  Denies any significant urinary discomfort.  At the urgent care, she reports that they were concerned due to patient's rebound tenderness.  No other reported symptoms such as nausea, vomiting, diarrhea, or fevers.  No prior history of any abdominal surgeries that she can recall.  States that IUD had initially been placed by [facility] in December of 2024. Unsure provider.   Abdominal Pain      Prior to Admission medications   Medication Sig Start Date End Date Taking? Authorizing Provider  oxyCODONE -acetaminophen  (PERCOCET/ROXICET) 5-325 MG tablet Take 1 tablet by mouth every 6 (six) hours as needed for severe pain (pain score 7-10). 01/05/24  Yes Maddyx Vallie A, PA-C  norgestimate -ethinyl estradiol  (SPRINTEC 28) 0.25-35 MG-MCG tablet Take 1 tablet by mouth daily. 01/05/24  Yes Arad Burston A, PA-C    Allergies: Fish allergy and Parsley seed    Review of Systems  Gastrointestinal:  Positive for abdominal pain.  All other systems reviewed and are negative.   Updated Vital Signs BP 129/80 (BP Location: Right Arm)   Pulse 68   Temp 98.4 F (36.9 C) (Oral)   Resp 18   LMP 12/21/2023 (Exact Date)   SpO2 100%   Physical Exam Vitals and nursing note reviewed.  Constitutional:      General: She is not in acute distress.    Appearance: She is well-developed.  HENT:     Head: Normocephalic and atraumatic.  Eyes:      Conjunctiva/sclera: Conjunctivae normal.  Cardiovascular:     Rate and Rhythm: Normal rate and regular rhythm.     Heart sounds: No murmur heard. Pulmonary:     Effort: Pulmonary effort is normal. No respiratory distress.     Breath sounds: Normal breath sounds.  Abdominal:     Palpations: Abdomen is soft.     Tenderness: There is abdominal tenderness in the right lower quadrant. There is right CVA tenderness and rebound. There is no left CVA tenderness.     Comments: Minimal tenderness palpation in the right lower quadrant, notable rebound tenderness in the right lower quadrant.  No referred pain.  Musculoskeletal:        General: No swelling.     Cervical back: Neck supple.  Skin:    General: Skin is warm and dry.     Capillary Refill: Capillary refill takes less than 2 seconds.  Neurological:     Mental Status: She is alert.  Psychiatric:        Mood and Affect: Mood normal.     (all labs ordered are listed, but only abnormal results are displayed) Labs Reviewed  COMPREHENSIVE METABOLIC PANEL WITH GFR - Abnormal; Notable for the following components:      Result Value   Glucose, Bld 124 (*)    All other components within normal limits  LIPASE, BLOOD  CBC  HCG, SERUM, QUALITATIVE    EKG: None  Radiology: CT ABDOMEN PELVIS W CONTRAST Result Date: 01/05/2024 CLINICAL DATA:  Right lower quadrant pain.  Rebound tenderness. EXAM: CT ABDOMEN AND PELVIS WITH CONTRAST TECHNIQUE: Multidetector CT imaging of the abdomen and pelvis was performed using the standard protocol following bolus administration of intravenous contrast. RADIATION DOSE REDUCTION: This exam was performed according to the departmental dose-optimization program which includes automated exposure control, adjustment of the mA and/or kV according to patient size and/or use of iterative reconstruction technique. CONTRAST:  OMNIPAQUE  IOHEXOL  300 MG/ML  SOLN COMPARISON:  None Available. FINDINGS: Lower Chest: No  acute findings. Hepatobiliary: No suspicious hepatic masses identified. Gallbladder is unremarkable. No evidence of biliary ductal dilatation. Pancreas:  No mass or inflammatory changes. Spleen: Within normal limits in size and appearance. Adrenals/Urinary Tract: No suspicious masses identified. No evidence of ureteral calculi or hydronephrosis. Stomach/Bowel: No evidence of obstruction, inflammatory process or abnormal fluid collections. Although the appendix is not directly visualized, no inflammatory process seen in region of the cecum. Vascular/Lymphatic: No pathologically enlarged lymph nodes. No acute vascular findings. Reproductive: Extra uterine location of IUD is seen in the right lower quadrant. No pelvic mass or abnormal free fluid identified. Other:  None. Musculoskeletal:  No suspicious bone lesions identified. IMPRESSION: Extra-uterine location of IUD in the right lower quadrant. No other acute findings. Electronically Signed   By: Norleen DELENA Kil M.D.   On: 01/05/2024 16:43     Procedures   Medications Ordered in the ED  fentaNYL  (SUBLIMAZE ) injection 50 mcg (50 mcg Intravenous Given 01/05/24 1538)  ondansetron  (ZOFRAN ) injection 4 mg (4 mg Intravenous Given 01/05/24 1538)  iohexol  (OMNIPAQUE ) 300 MG/ML solution 100 mL (100 mLs Intravenous Contrast Given 01/05/24 1627)                                    Medical Decision Making Amount and/or Complexity of Data Reviewed Labs: ordered. Radiology: ordered.  Risk Prescription drug management.   This patient presents to the ED for concern of abdominal pain, this involves an extensive number of treatment options, and is a complaint that carries with it a high risk of complications and morbidity.  The differential diagnosis includes diverticulitis, appendicitis, UTI, pyelonephritis, urolithiasis, ovarian torsion   Co morbidities that complicate the patient evaluation  None   Lab Tests:  I Ordered, and personally interpreted labs.   The pertinent results include: CBC unremarkable, CMP with slight hyperglycemia 124, lipase unremarkable 36, hCG negative   Imaging Studies ordered:  I ordered imaging studies including CT abdomen pelvis I independently visualized and interpreted imaging which showed Extra-uterine location of IUD in the right lower quadrant. No other acute findings. I agree with the radiologist interpretation   Consultations Obtained:  I requested consultation with the OBGYN,  and discussed lab and imaging findings as well as pertinent plan - they recommend: Spoke with Dr. Izell, OBGYN, who advised that she will need surgical evaluation but this can be done outpatient.   Problem List / ED Course / Critical interventions / Medication management  Patient without significant medical history presents the emergency department with concerns of abdominal pain.  Reportedly seen urgent care advised to come the emergency department for concerns of abdominal pain.  She reports that pain began last night with no significant associated symptoms. States that she was told by UC to seek evaluation for possible appencitis r/o. No history of abdominal surgeries. No reported ovarian abnormalities. Denies nausea, vomiting, or diarrhea. Exam shows focal RLQ TTP with notable rebound tenderness.  There is also R CVA tenderness but left is unremarkable. Based on exam, will proceed with CT imaging to rule out possible appendicitis vs pyelonephritis vs urolithiasis. She is stable at this time. Basic labs also added on for further assessment. Labs unremarkable. CTAP concerning for extrauterine IUD. Consult to OBGYN placed. Spoke with Dr. Izell, OBGYN, who advised that if she is stable, can follow up outpatient for surgical management. Advised OCP use in the meantime as her IUD is essentially non-effective as birth control at this time. Patient agreeable with this plan and wanting to proceed with outpatient follow up. Sp[facility] prescribed  as OCP. Percocet for pain sent to pharmacy. Return precautions discussed such as concerns for new or worsening symptoms. Discharged home in stable condition. I ordered medication including fentanyl , Zofran   for pain, nausea  Reevaluation of the patient after these medicines showed that the patient improved I have reviewed the patients home medicines and have made adjustments as needed  Test / Admission - Considered:  Stable for outpatient follow up at this time.  Final diagnoses:  Displacement of intrauterine contraceptive device, initial encounter  OCP (oral contraceptive pills) initiation    ED Discharge Orders          Ordered    norgestimate -ethinyl estradiol  (SPRINTEC 28) 0.25-35 MG-MCG tablet  Daily        01/05/24 1838    oxyCODONE -acetaminophen  (PERCOCET/ROXICET) 5-325 MG tablet  Every 6 hours PRN        01/05/24 1840               Loretto Belinsky A, PA-C 01/05/24 1842    Zackowski, Scott, MD 01/05/24 [phone]

## 2024-01-05 NOTE — Discharge Instructions (Addendum)
 You were seen in the ER today for concerns of abdominal pain. Your CT scan was concerning for an abnormal location of your IUD. This will need to be managed by an OBGYN and you can schedule with [facility] for evaluation by one of their physicians. For any concerns of new or worsening symptoms, return to the ER.  I have started you on an oral birth control in the meantime as your current IUD is not functioning as birth control given its location.

## 2024-01-05 NOTE — Discharge Instructions (Addendum)
 Please go to the nearest emergency department for further workup and evaluation.

## 2024-01-05 NOTE — ED Triage Notes (Signed)
 Pt sent from UC to r/o appendicitis. Pt has RLQ pain with rebound pain, decreased appetite since last night.

## 2024-02-21 ENCOUNTER — Ambulatory Visit: Admitting: Obstetrics & Gynecology

## 2024-07-10 ENCOUNTER — Emergency Department (HOSPITAL_COMMUNITY)
Admission: EM | Admit: 2024-07-10 | Discharge: 2024-07-11 | Disposition: A | Discharge status: Home / Self Care | Attending: Emergency Medicine | Admitting: Emergency Medicine

## 2024-07-10 ENCOUNTER — Emergency Department (HOSPITAL_COMMUNITY)

## 2024-07-10 ENCOUNTER — Encounter (HOSPITAL_COMMUNITY): Payer: Self-pay

## 2024-07-10 ENCOUNTER — Other Ambulatory Visit: Payer: Self-pay

## 2024-07-10 DIAGNOSIS — W009XXA Unspecified fall due to ice and snow, initial encounter: Secondary | ICD-10-CM | POA: Diagnosis not present

## 2024-07-10 DIAGNOSIS — M25572 Pain in left ankle and joints of left foot: Secondary | ICD-10-CM | POA: Insufficient documentation

## 2024-07-10 NOTE — ED Triage Notes (Signed)
 Pt slipped on Ice and rolled her ankled, the affected ankle is the left ankle, Some swelling noted but can move the ankle. She took tylenol  and motrin  prior to arrival.

## 2024-07-11 ENCOUNTER — Emergency Department (HOSPITAL_COMMUNITY)

## 2024-07-11 MED ORDER — IBUPROFEN 400 MG PO TABS
600.0000 mg | ORAL_TABLET | Freq: Once | ORAL | Status: AC
  Administered 2024-07-11: 600 mg via ORAL
  Filled 2024-07-11: qty 1

## 2024-07-11 MED ORDER — IBUPROFEN 600 MG PO TABS: 600.0000 mg | ORAL_TABLET | Freq: Four times a day (QID) | ORAL | 0 refills | Status: AC | PRN

## 2024-07-11 NOTE — Discharge Instructions (Addendum)
 You were seen in the ER today for evaluation of your ankle pain. Your XR did not show a fracture or break, but you could of injured a tendon or ligament. You will need to follow up with orthopedics for re-evaluation and further care. Please call to schedule an appointment. I have included information on the RICE method discussed with you earlier. For pain, you can take 1000mg  of Tylenol  and/or 600mg  of ibuprofen  every 6 hours as needed for pain. If you have any concerns, new or worsening symptoms, please return to the nearest ER for re-evaluation.

## 2024-07-11 NOTE — Progress Notes (Signed)
 Orthopedic Tech Progress Note Patient Details:  Brandi Austin [birth year] [id number]  Ortho Devices Type of Ortho Device: CAM walker, Crutches Ortho Device/Splint Location: lle Ortho Device/Splint Interventions: Ordered, Application, Adjustment   Post Interventions Patient Tolerated: Well Instructions Provided: Care of device, Adjustment of device  Chandra Dorn PARAS 07/11/2024, 7:28 AM

## 2024-07-11 NOTE — ED Provider Notes (Signed)
 " [place] EMERGENCY DEPARTMENT AT [facility] Provider Note   CSN: [id number] Arrival date & time: 07/10/24  2344     Patient presents with: Ankle Pain   Brandi Austin is a 34 y.o. female self reportedly otherwise healthy presents to the ER today for evaluation of left ankle pain. Last night, the patient slipped on ice and inverted her ankle. Has been having pain to the lateral malleoli. Took tylenol  for pain. Denies any other injury. NKDA.    Ankle Pain Associated symptoms: no fever        Prior to Admission medications  Medication Sig Start Date End Date Taking? Authorizing Provider  norgestimate -ethinyl estradiol  (SPRINTEC 28) 0.25-35 MG-MCG tablet Take 1 tablet by mouth daily. 01/05/24   Zelaya, Oscar A, PA-C  oxyCODONE -acetaminophen  (PERCOCET/ROXICET) 5-325 MG tablet Take 1 tablet by mouth every 6 (six) hours as needed for severe pain (pain score 7-10). 01/05/24   Zelaya, Oscar A, PA-C    Allergies: Fish allergy and Parsley seed    Review of Systems  Constitutional:  Negative for chills and fever.  Musculoskeletal:  Positive for arthralgias.    Updated Vital Signs BP (!) 114/54 (BP Location: Left Arm)   Pulse 60   Temp 97.8 F (36.6 C)   Resp 16   SpO2 100%   Physical Exam Vitals and nursing note reviewed.  Constitutional:      General: She is not in acute distress.    Appearance: She is not toxic-appearing.  Eyes:     General: No scleral icterus. Pulmonary:     Effort: Pulmonary effort is normal. No respiratory distress.  Musculoskeletal:        General: Tenderness present.     Comments: Some tenderness to the lateral malleoli on the left ankle. No tenderness into the foot or medial malleoli. No skin breaks. Some very minimal swelling to the latearl malleoli. No increase in warmth or erythema.  Patient still dorsi and plantarflex.  Brisk cap refill present in all toes.  Sensation fully intact and symmetric.  Palpable pulses.   Compartments are soft.  No tenderness into the tibia fibula area.  Skin:    General: Skin is warm and dry.  Neurological:     Mental Status: She is alert.     (all labs ordered are listed, but only abnormal results are displayed) Labs Reviewed - No data to display  EKG: None  Radiology: DG Ankle Complete Left Result Date: 07/11/2024 EXAM: 3 OR MORE VIEW(S) XRAY OF THE LEFT ANKLE 07/11/2024 12:10:00 AM CLINICAL HISTORY: Fall with rolled ankle. COMPARISON: None available. FINDINGS: BONES AND JOINTS: No acute fracture. No malalignment. SOFT TISSUES: Unremarkable. IMPRESSION: 1. No acute fracture or dislocation. Electronically signed by: Dorethia Molt MD 07/11/2024 12:37 AM EST RP Workstation: [id number]   Procedures   Medications Ordered in the ED - No data to display    Medical Decision Making Amount and/or Complexity of Data Reviewed Radiology: ordered.  Risk Prescription drug management.   34 y.o. female presents to the ER for evaluation of ankle pain. Differential diagnosis includes but is not limited to soft tissue injury, bony injury, contusion. Vital signs pulse rate 56, otherwise unremarkable. Physical exam as noted above.   Workup initiated in triage.  XR ankle 1. No acute fracture or dislocation. Per radiologist's interpretation.  Ibuprofen  ordered for pain.  Patient is neurovasc intact distally.  Workup and physical lamination reassuring.  Does have some tenderness to the lateral malleoli.  Likely a sprain.  Will give her orthopedic follow-up.  Cam boot and crutches initiated here as well.  Discussion of RICE method as well supportive care were discussed and recommended follow-up with orthopedics.  Stable for discharge home.  We discussed the results of the labs/imaging. The plan is supportive care, and orthopedic follow up. We discussed strict return precautions and red flag symptoms. The patient verbalized their understanding and agrees to the plan. The patient is  stable and being discharged home in good condition.  Portions of this report may have been transcribed using voice recognition software. Every effort was made to ensure accuracy; however, inadvertent computerized transcription errors may be present.    Final diagnoses:  None    ED Discharge Orders     None          Bernis Ernst, [place] 07/11/24 0732    Darra Fonda MATSU, MD 07/11/24 [phone]  "

## 2024-07-22 ENCOUNTER — Ambulatory Visit: Admitting: Orthopedic Surgery

## 2024-08-06 ENCOUNTER — Ambulatory Visit: Admitting: Obstetrics and Gynecology
